# Patient Record
Sex: Female | Born: 1955 | Race: White | Hispanic: No | Marital: Married | Smoking: Never smoker
Health system: Southern US, Community
[De-identification: ages and names within clinical notes are randomized; demographics above are authoritative.]

## PROBLEM LIST (undated history)

## (undated) DIAGNOSIS — C9210 Chronic myeloid leukemia, BCR/ABL-positive, not having achieved remission: Secondary | ICD-10-CM

## (undated) DIAGNOSIS — F32A Depression, unspecified: Secondary | ICD-10-CM

## (undated) DIAGNOSIS — F329 Major depressive disorder, single episode, unspecified: Secondary | ICD-10-CM

## (undated) HISTORY — PX: EXPLORATORY LAPAROTOMY: SUR591

---

## 1998-01-09 ENCOUNTER — Other Ambulatory Visit: Admission: RE | Admit: 1998-01-09 | Discharge: 1998-01-09 | Payer: Self-pay | Admitting: Obstetrics and Gynecology

## 1999-04-22 ENCOUNTER — Encounter: Admission: RE | Admit: 1999-04-22 | Discharge: 1999-04-22 | Payer: Self-pay | Admitting: Family Medicine

## 2001-08-19 ENCOUNTER — Other Ambulatory Visit: Admission: RE | Admit: 2001-08-19 | Discharge: 2001-08-19 | Payer: Self-pay | Admitting: *Deleted

## 2002-08-21 ENCOUNTER — Other Ambulatory Visit: Admission: RE | Admit: 2002-08-21 | Discharge: 2002-08-21 | Payer: Self-pay | Admitting: *Deleted

## 2003-09-24 ENCOUNTER — Encounter: Admission: RE | Admit: 2003-09-24 | Discharge: 2003-09-24 | Payer: Self-pay | Admitting: Family Medicine

## 2003-09-26 ENCOUNTER — Ambulatory Visit (HOSPITAL_COMMUNITY): Admission: RE | Admit: 2003-09-26 | Discharge: 2003-09-26 | Payer: Self-pay | Admitting: Obstetrics

## 2005-05-15 ENCOUNTER — Ambulatory Visit (HOSPITAL_COMMUNITY): Admission: RE | Admit: 2005-05-15 | Discharge: 2005-05-15 | Payer: Self-pay | Admitting: Obstetrics

## 2006-07-13 IMAGING — US US TRANSVAGINAL NON-OB
1 series · 18 of 25 positions shown · non-contrast
Comparison: None.

CLINICAL DATA: Right ovarian cyst.
 TRANSABDOMINAL AND TRANSVAGINAL PELVIC ULTRASOUND:
TECHNIQUE: Both transabdominal and transvaginal ultrasound examinations of the pelvis were performed including evaluation of the uterus, ovaries, adnexal regions, and pelvic cul-de-sac.

[Series 1: us transvaginal non-ob · 18 of 42 slices shown]
[im 1/42]
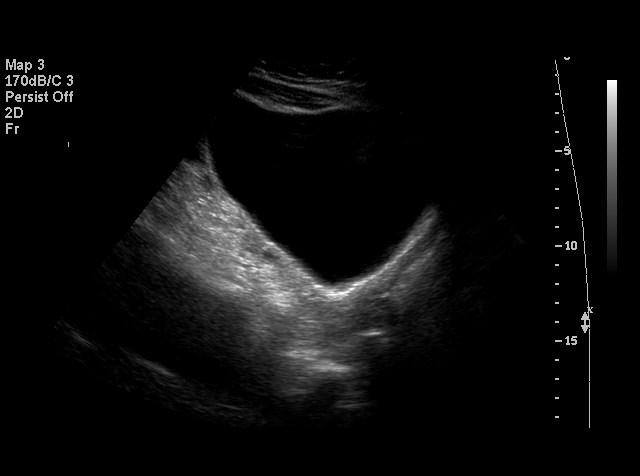
[im 4/42]
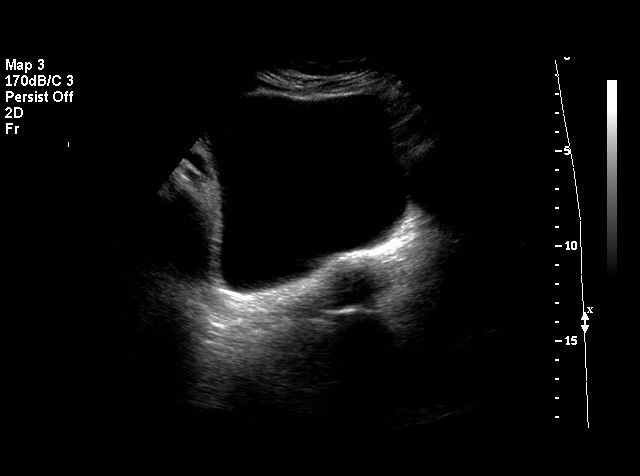
[im 6/42]
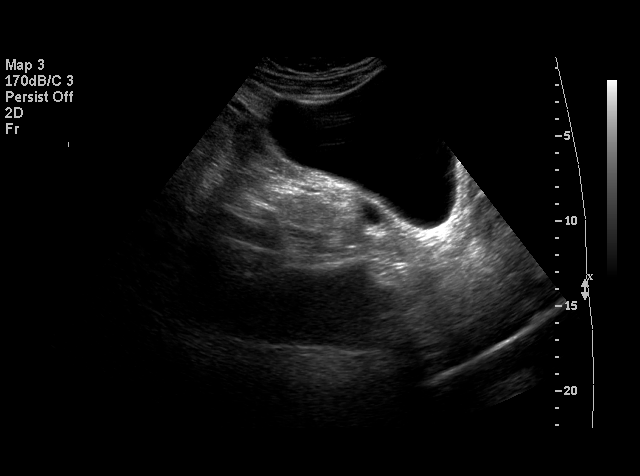
[im 7/42]
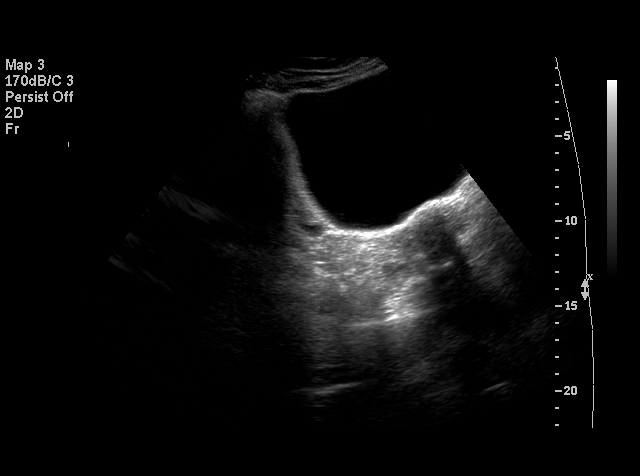
[im 11/42]
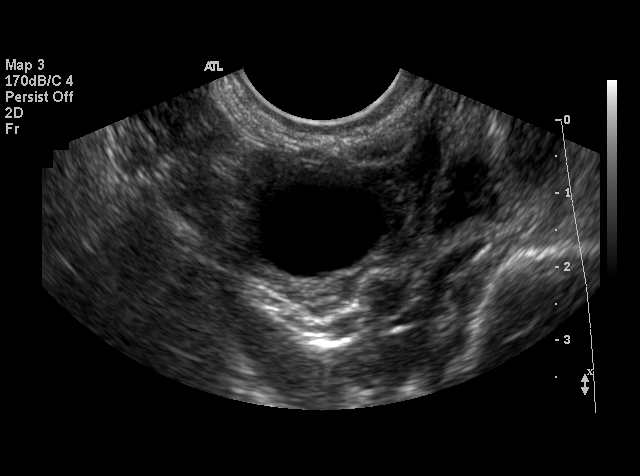
[im 12/42]
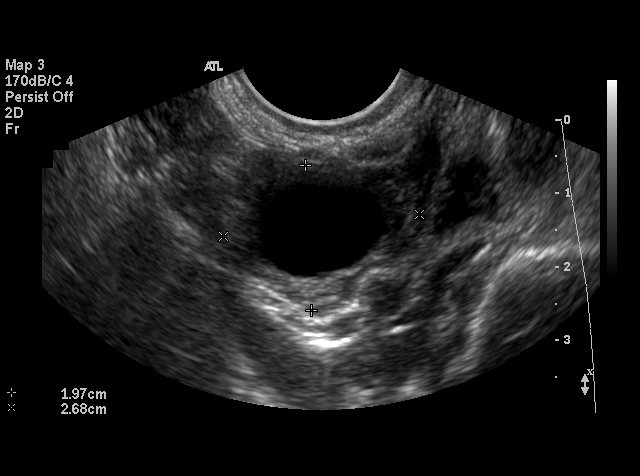
[im 16/42]
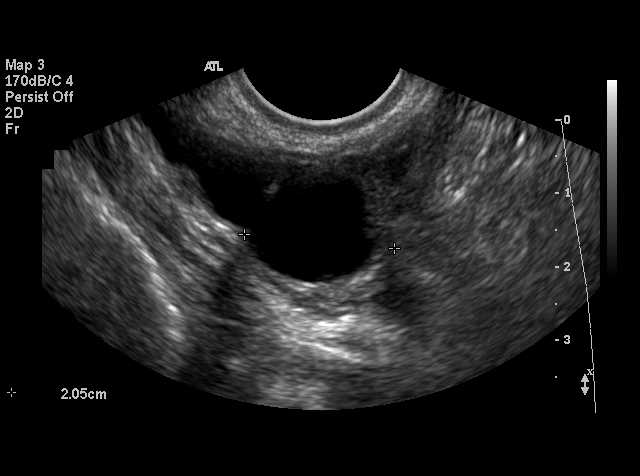
[im 18/42]
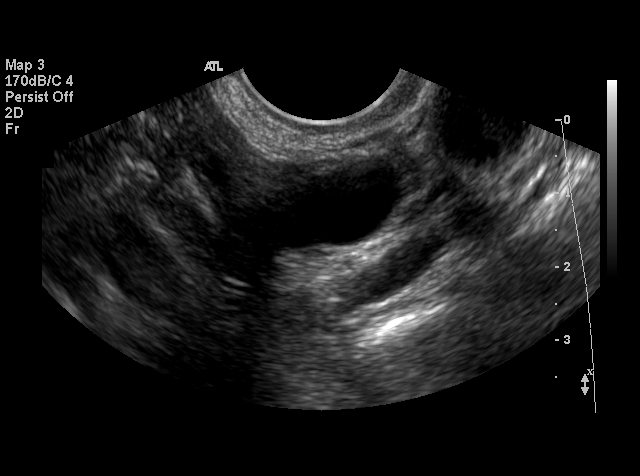
[im 19/42]
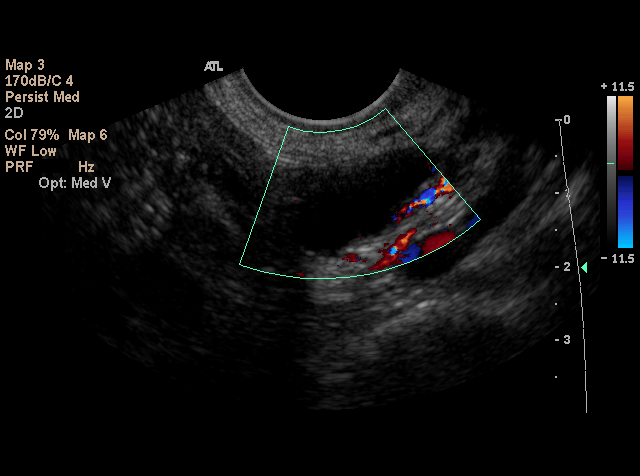
[im 23/42]
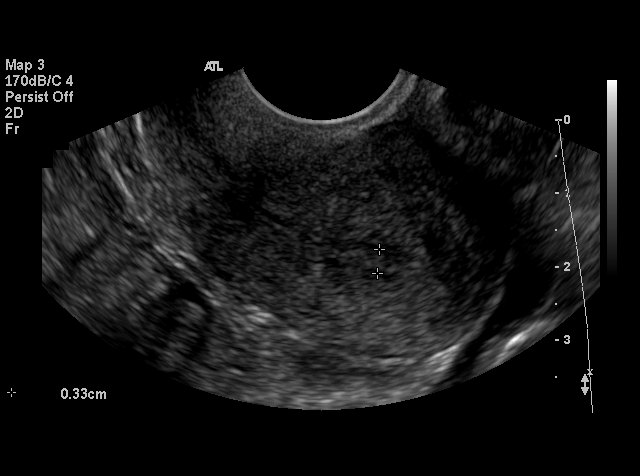
[im 24/42]
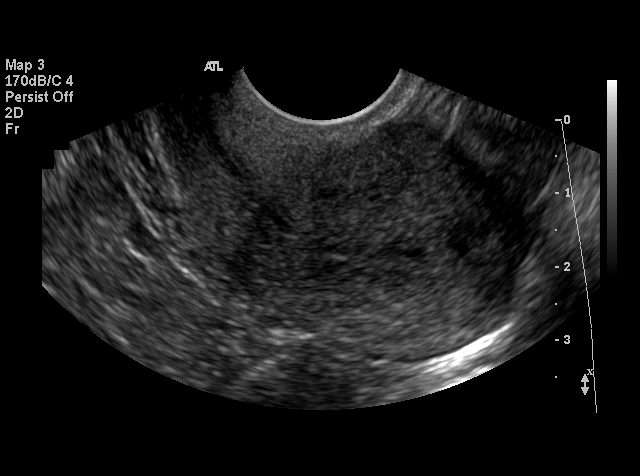
[im 26/42]
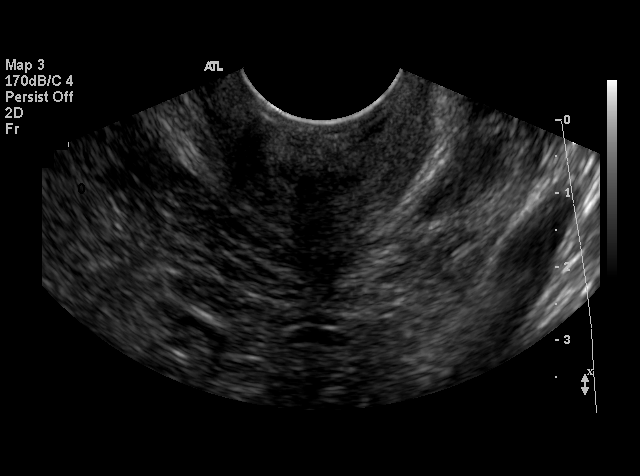
[im 30/42]
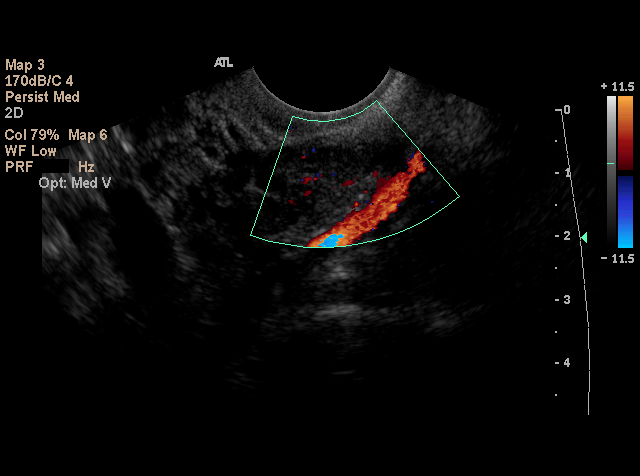
[im 31/42]
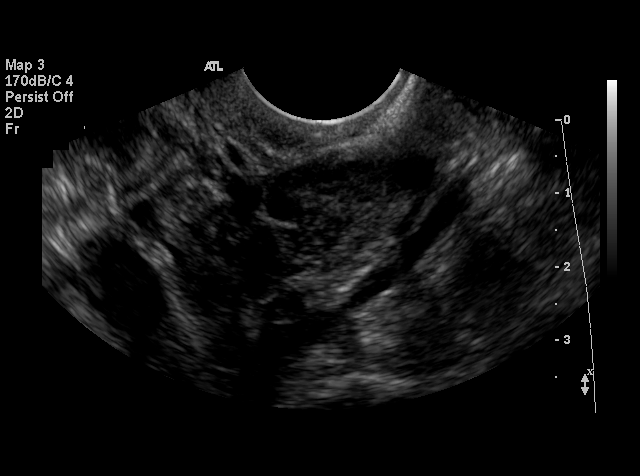
[im 35/42]
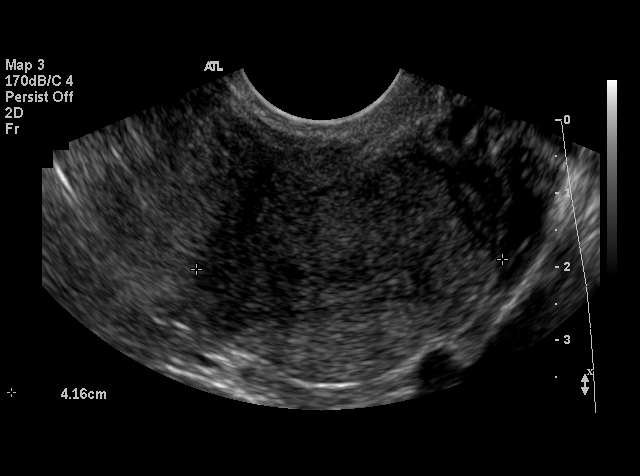
[im 36/42]
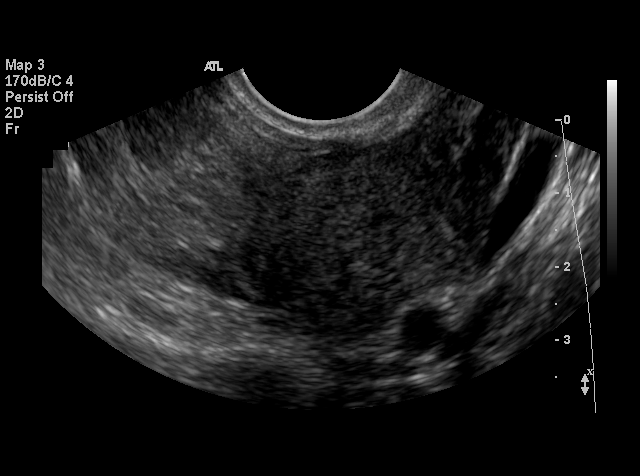
[im 38/42]
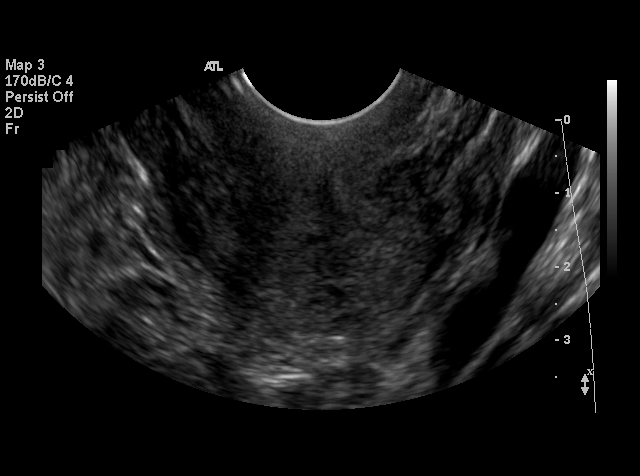
[im 42/42]
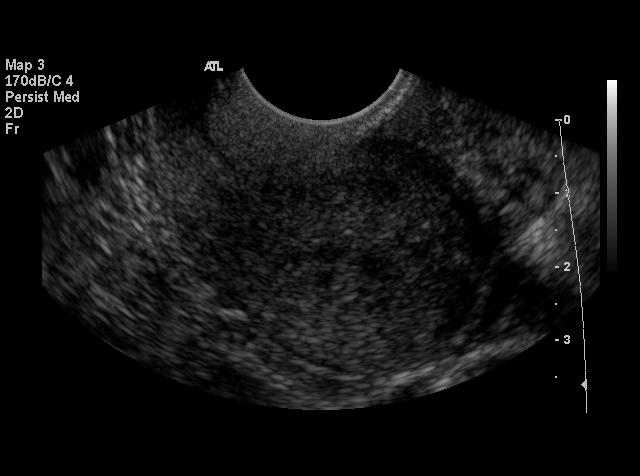

[18 of 25 positions shown; findings below may reference images not displayed]

FINDINGS: The uterus measures 5.6 x 3.3 x 4.2 cm.  The endometrial stripe is 3-4 mm in thickness.  Myometrial echo texture is homogeneous.  
 The right ovary measures 2.0 x 2.7 x 2.1 cm.  A 1.9 cm dominant follicle or small cyst is seen in the right ovary.  There is a tubular fluid-filled structure adjacent to the right ovary which likely represents hydrosalpinx. 
 The left ovary measures 2.3 x 1.5 x 2.7 cm and has a normal sonographic appearance.  No free fluid is apparent in the cul-de-sac.
IMPRESSION: Dominant follicle or small simple cyst in the right ovary.  There is a tubular fluid focus adjacent to the right ovary suggesting associated hydrosalpinx.  Follow-up ultrasound in six weeks may prove helpful to further evaluate.

## 2007-06-01 ENCOUNTER — Ambulatory Visit (HOSPITAL_COMMUNITY): Admission: RE | Admit: 2007-06-01 | Discharge: 2007-06-01 | Payer: Self-pay | Admitting: Obstetrics

## 2009-01-01 ENCOUNTER — Encounter: Admission: RE | Admit: 2009-01-01 | Discharge: 2009-01-01 | Payer: Self-pay | Admitting: Obstetrics and Gynecology

## 2010-07-05 ENCOUNTER — Encounter: Payer: Self-pay | Admitting: Obstetrics

## 2010-12-02 ENCOUNTER — Other Ambulatory Visit (HOSPITAL_COMMUNITY): Payer: Self-pay | Admitting: Obstetrics and Gynecology

## 2010-12-02 DIAGNOSIS — Z1231 Encounter for screening mammogram for malignant neoplasm of breast: Secondary | ICD-10-CM

## 2010-12-03 ENCOUNTER — Ambulatory Visit (HOSPITAL_COMMUNITY)
Admission: RE | Admit: 2010-12-03 | Discharge: 2010-12-03 | Disposition: A | Discharge status: Home / Self Care | Payer: Managed Care, Other (non HMO) | Source: Ambulatory Visit | Attending: Obstetrics and Gynecology | Admitting: Obstetrics and Gynecology

## 2010-12-03 DIAGNOSIS — Z1231 Encounter for screening mammogram for malignant neoplasm of breast: Secondary | ICD-10-CM | POA: Insufficient documentation

## 2015-09-29 ENCOUNTER — Emergency Department (HOSPITAL_BASED_OUTPATIENT_CLINIC_OR_DEPARTMENT_OTHER)
Admission: EM | Admit: 2015-09-29 | Discharge: 2015-09-29 | Disposition: A | Discharge status: Home / Self Care | Payer: Managed Care, Other (non HMO) | Attending: Emergency Medicine | Admitting: Emergency Medicine

## 2015-09-29 ENCOUNTER — Encounter (HOSPITAL_BASED_OUTPATIENT_CLINIC_OR_DEPARTMENT_OTHER): Payer: Self-pay | Admitting: Emergency Medicine

## 2015-09-29 DIAGNOSIS — Y9389 Activity, other specified: Secondary | ICD-10-CM | POA: Insufficient documentation

## 2015-09-29 DIAGNOSIS — S61219A Laceration without foreign body of unspecified finger without damage to nail, initial encounter: Secondary | ICD-10-CM

## 2015-09-29 DIAGNOSIS — Y9289 Other specified places as the place of occurrence of the external cause: Secondary | ICD-10-CM | POA: Insufficient documentation

## 2015-09-29 DIAGNOSIS — W260XXA Contact with knife, initial encounter: Secondary | ICD-10-CM | POA: Insufficient documentation

## 2015-09-29 DIAGNOSIS — Y998 Other external cause status: Secondary | ICD-10-CM | POA: Insufficient documentation

## 2015-09-29 DIAGNOSIS — Z856 Personal history of leukemia: Secondary | ICD-10-CM | POA: Insufficient documentation

## 2015-09-29 DIAGNOSIS — S61215A Laceration without foreign body of left ring finger without damage to nail, initial encounter: Secondary | ICD-10-CM | POA: Insufficient documentation

## 2015-09-29 DIAGNOSIS — Z880 Allergy status to penicillin: Secondary | ICD-10-CM | POA: Insufficient documentation

## 2015-09-29 HISTORY — DX: Chronic myeloid leukemia, BCR/ABL-positive, not having achieved remission: C92.10

## 2015-09-29 NOTE — ED Notes (Signed)
Dr Maryan Rued in room with patient now.  Pt states last tetanus shot was about 8 years ago but is unsure of receiving the vaccine while on chemotherapy medications.  Pt states scissors "was not a very clean pair."

## 2015-09-29 NOTE — ED Notes (Signed)
Pt in with laceration to L ring finger from knife today while cutting a tie. Bleeding controlled, TDap 8 years ago.

## 2015-09-29 NOTE — Discharge Instructions (Signed)
Nonsutured Laceration Care °A laceration is a cut that goes through all layers of the skin and extends into the tissue that is right under the skin. This type of cut is usually stitched up (sutured) or closed with tape (adhesive strips) or skin glue shortly after the injury happens. °However, if the wound is dirty or if several hours pass before medical treatment is provided, it is likely that germs (bacteria) will enter the wound. Closing a laceration after bacteria have entered it increases the risk of infection. In these cases, your health care provider may leave the laceration open (nonsutured) and cover it with a bandage. This type of treatment helps prevent infection and allows the wound to heal from the deepest layer of tissue damage up to the surface. °An open fracture is a type of injury that may involve nonsutured lacerations. An open fracture is a break in a bone that happens along with one or more lacerations through the skin that is near the fracture site. °HOW TO CARE FOR YOUR NONSUTURED LACERATION °· Take or apply over-the-counter and prescription medicines only as told by your health care provider. °· If you were prescribed an antibiotic medicine, take or apply it as told by your health care provider. Do not stop using the antibiotic even if your condition improves. °· Clean the wound one time each day or as told by your health care provider. °¨ Wash the wound with mild soap and water. °¨ Rinse the wound with water to remove all soap. °¨ Pat your wound dry with a clean towel. Do not rub the wound. °· Do not inject anything into the wound unless your health care provider told you to. °· Change any bandages (dressings) as told by your health care provider. This includes changing the dressing if it gets wet, dirty, or starts to smell bad. °· Keep the dressing dry until your health care provider says it can be removed. Do not take baths, swim, or do anything that puts your wound underwater until your  health care provider approves. °· Raise (elevate) the injured area above the level of your heart while you are sitting or lying down, if possible. °· Do not scratch or pick at the wound. °· Check your wound every day for signs of infection. Watch for: °¨ Redness, swelling, or pain. °¨ Fluid, blood, or pus. °· Keep all follow-up visits as told by your health care provider. This is important. °SEEK MEDICAL CARE IF: °· You received a tetanus and shot and you have swelling, severe pain, redness, or bleeding at the injection site.   °· You have a fever. °· Your pain is not controlled with medicine. °· You have increased redness, swelling, or pain at the site of your wound. °· You have fluid, blood, or pus coming from your wound. °· You notice a bad smell coming from your wound or your dressing. °· You notice something coming out of the wound, such as wood or glass. °· You notice a change in the color of your skin near your wound. °· You develop a new rash. °· You need to change the dressing frequently due to fluid, blood, or pus draining from the wound. °· You develop numbness around your wound. °SEEK IMMEDIATE MEDICAL CARE IF: °· Your pain suddenly increases and is severe. °· You develop severe swelling around the wound. °· The wound is on your hand or foot and you cannot properly move a finger or toe. °· The wound is on your hand or   foot and you notice that your fingers or toes look pale or bluish. °· You have a red streak going away from your wound. °  °This information is not intended to replace advice given to you by your health care provider. Make sure you discuss any questions you have with your health care provider. °  °Document Released: 04/29/2006 Document Revised: 10/16/2014 Document Reviewed: 05/28/2014 °Elsevier Interactive Patient Education ©2016 [employer] ° °

## 2015-09-29 NOTE — ED Provider Notes (Signed)
CSN: [id number]     Arrival date & time 09/29/15  1654 History  By signing my name below, I, [name], attest that this documentation has been prepared under the direction and in the presence of Blanchie Dessert, MD.   Electronically Signed: [name], ED Scribe. 09/29/2015. 6:34 PM  Chief Complaint  Patient presents with  . Extremity Laceration    The history is provided by the patient. No language interpreter was used.   HPI Comments: Krystal Price is a 60 y.o. female with PMHx of CML who presents to the Emergency Department complaining of sudden onset, left ring finger laceration, onset earlier today when she cut the area on a knife. She reports associated pain to the area. No other associated symptoms noted. No worsening or alleviating factors noted. Pt denies numbness, weakness, or any other pertinent symptoms. Pt had her last tetanus shot 8 years ago but requested not to have her tetanus updated.   Past Medical History  Diagnosis Date  . CML (chronic myelocytic leukemia) ([place])    Past Surgical History  Procedure Laterality Date  . Exploratory laparotomy     History reviewed. No pertinent family history. Social History  Substance Use Topics  . Smoking status: Never Smoker   . Smokeless tobacco: None  . Alcohol Use: No   OB History    No data available     Review of Systems A complete 10 system review of systems was obtained and all systems are negative except as noted in the HPI and PMH.   Allergies  Penicillins  Home Medications   Prior to Admission medications   Not on File   BP 139/98 mmHg  Pulse 66  Temp(Src) 98.9 F (37.2 C) (Oral)  Resp 18  Ht 5\' 5"  (1.651 m)  Wt 130 lb (58.968 kg)  BMI 21.63 kg/m2  SpO2 100% Physical Exam  Constitutional: She is oriented to person, place, and time. She appears well-developed and well-nourished.  HENT:  Head: Normocephalic.  Eyes: EOM are normal.  Neck: Normal range of motion.  Pulmonary/Chest:  Effort normal.  Abdominal: She exhibits no distension.  Musculoskeletal: Normal range of motion.  Neurological: She is alert and oriented to person, place, and time.  Skin: Laceration noted.  1 cm laceration to the left ring finger over the palmar DIP joint. Hemostatic. Not gaping. Normal PIP and DIP function. Normal sensation noted.   Psychiatric: She has a normal mood and affect.  Nursing note and vitals reviewed.   ED Course  Procedures (including critical care time) DIAGNOSTIC STUDIES: Oxygen Saturation is 100% on RA, normal by my interpretation.    COORDINATION OF CARE: 6:50 PM-Discussed treatment plan with pt at bedside and pt agreed to plan.   Labs Review Labs Reviewed - No data to display  Imaging Review No results found.   EKG Interpretation None      LACERATION REPAIR Performed by: Blanchie Dessert Authorized byBlanchie Dessert Consent: Verbal consent obtained. Risks and benefits: risks, benefits and alternatives were discussed Consent given by: patient Patient identity confirmed: provided demographic data Prepped and Draped in normal sterile fashion Wound explored  Laceration Location: left ring finger  Laceration Length: 1cm  No Foreign Bodies seen or palpated  Anesthesia: noneIrrigation method: syringe Amount of cleaning: standard  Skin closure: dermabond  Patient tolerance: Patient tolerated the procedure well with no immediate complications.   MDM   Final diagnoses:  Finger laceration, initial encounter   Patient with uncomplicated superficial laceration to the palmar surface  of the left ring finger. She has full range of motion at the DIP and PIP joint without evidence of tendon injury.  I personally performed the services described in this documentation, which was scribed in my presence.  The recorded information has been reviewed and considered.    Blanchie Dessert, MD 09/29/15 2322

## 2016-01-18 ENCOUNTER — Emergency Department (HOSPITAL_COMMUNITY): Payer: BLUE CROSS/BLUE SHIELD

## 2016-01-18 ENCOUNTER — Encounter (HOSPITAL_COMMUNITY): Payer: Self-pay | Admitting: Emergency Medicine

## 2016-01-18 ENCOUNTER — Emergency Department (HOSPITAL_COMMUNITY)
Admission: EM | Admit: 2016-01-18 | Discharge: 2016-01-18 | Payer: BLUE CROSS/BLUE SHIELD | Attending: Physician Assistant | Admitting: Physician Assistant

## 2016-01-18 DIAGNOSIS — Z79899 Other long term (current) drug therapy: Secondary | ICD-10-CM | POA: Insufficient documentation

## 2016-01-18 DIAGNOSIS — R21 Rash and other nonspecific skin eruption: Secondary | ICD-10-CM

## 2016-01-18 DIAGNOSIS — R51 Headache: Secondary | ICD-10-CM | POA: Diagnosis not present

## 2016-01-18 DIAGNOSIS — Z791 Long term (current) use of non-steroidal anti-inflammatories (NSAID): Secondary | ICD-10-CM | POA: Insufficient documentation

## 2016-01-18 DIAGNOSIS — M791 Myalgia: Secondary | ICD-10-CM | POA: Diagnosis present

## 2016-01-18 DIAGNOSIS — R509 Fever, unspecified: Secondary | ICD-10-CM | POA: Diagnosis not present

## 2016-01-18 HISTORY — DX: Depression, unspecified: F32.A

## 2016-01-18 HISTORY — DX: Major depressive disorder, single episode, unspecified: F32.9

## 2016-01-18 LAB — COMPREHENSIVE METABOLIC PANEL
ALT: 107 U/L — ABNORMAL HIGH (ref 14–54)
AST: 88 U/L — ABNORMAL HIGH (ref 15–41)
Albumin: 3.9 g/dL (ref 3.5–5.0)
Alkaline Phosphatase: 90 U/L (ref 38–126)
Anion gap: 8 (ref 5–15)
BUN: 14 mg/dL (ref 6–20)
CO2: 25 mmol/L (ref 22–32)
Calcium: 8.4 mg/dL — ABNORMAL LOW (ref 8.9–10.3)
Chloride: 101 mmol/L (ref 101–111)
Creatinine, Ser: 0.65 mg/dL (ref 0.44–1.00)
GFR calc Af Amer: >60 mL/min (ref >60)
GFR calc non Af Amer: >60 mL/min (ref >60)
Glucose, Bld: 105 mg/dL — ABNORMAL HIGH (ref 65–99)
Potassium: 3.7 mmol/L (ref 3.5–5.1)
Sodium: 134 mmol/L — ABNORMAL LOW (ref 135–145)
Total Bilirubin: 0.7 mg/dL (ref 0.3–1.2)
Total Protein: 7.3 g/dL (ref 6.5–8.1)

## 2016-01-18 LAB — URINE MICROSCOPIC-ADD ON
Bacteria, UA: NONE SEEN
RBC / HPF: NONE SEEN RBC/hpf (ref 0–5)

## 2016-01-18 LAB — CBC WITH DIFFERENTIAL/PLATELET
Basophils Absolute: 0 10*3/uL (ref 0.0–0.1)
Basophils Relative: 0 %
Eosinophils Absolute: 0 10*3/uL (ref 0.0–0.7)
Eosinophils Relative: 0 %
HCT: 30.7 % — ABNORMAL LOW (ref 36.0–46.0)
Hemoglobin: 10.3 g/dL — ABNORMAL LOW (ref 12.0–15.0)
Lymphocytes Relative: 14 %
Lymphs Abs: 1.2 10*3/uL (ref 0.7–4.0)
MCH: 30.8 pg (ref 26.0–34.0)
MCHC: 33.6 g/dL (ref 30.0–36.0)
MCV: 91.9 fL (ref 78.0–100.0)
Monocytes Absolute: 0.9 10*3/uL (ref 0.1–1.0)
Monocytes Relative: 11 %
Neutro Abs: 6.4 10*3/uL (ref 1.7–7.7)
Neutrophils Relative %: 75 %
Platelets: 182 10*3/uL (ref 150–400)
RBC: 3.34 MIL/uL — ABNORMAL LOW (ref 3.87–5.11)
RDW: 13.8 % (ref 11.5–15.5)
WBC: 8.5 10*3/uL (ref 4.0–10.5)

## 2016-01-18 LAB — URINALYSIS, ROUTINE W REFLEX MICROSCOPIC
Bilirubin Urine: NEGATIVE
Glucose, UA: NEGATIVE mg/dL
Ketones, ur: 15 mg/dL — AB
Nitrite: NEGATIVE
Protein, ur: NEGATIVE mg/dL
Specific Gravity, Urine: 1.01 (ref 1.005–1.030)
pH: 6.5 (ref 5.0–8.0)

## 2016-01-18 LAB — I-STAT CG4 LACTIC ACID, ED
Lactic Acid, Venous: 0.95 mmol/L (ref 0.5–1.9)
Lactic Acid, Venous: 0.64 mmol/L (ref 0.5–1.9)

## 2016-01-18 LAB — CK: Total CK: 68 U/L (ref 38–234)

## 2016-01-18 MED ORDER — VANCOMYCIN HCL 10 G IV SOLR: 1500.0000 mg | INTRAVENOUS | Status: DC

## 2016-01-18 MED ORDER — VANCOMYCIN HCL IN DEXTROSE 1-5 GM/200ML-% IV SOLN
1000.0000 mg | INTRAVENOUS | Status: DC
Start: 2016-01-18 — End: 2016-01-18

## 2016-01-18 MED ORDER — SODIUM CHLORIDE 0.9 % IV BOLUS (SEPSIS)
1000.0000 mL | Freq: Once | INTRAVENOUS | Status: AC
  Administered 2016-01-18: 1000 mL via INTRAVENOUS

## 2016-01-18 MED ORDER — ACETAMINOPHEN 325 MG PO TABS
650.0000 mg | ORAL_TABLET | Freq: Once | ORAL | Status: AC
  Administered 2016-01-18: 650 mg via ORAL
  Filled 2016-01-18: qty 2

## 2016-01-18 MED ORDER — DOXYCYCLINE HYCLATE 100 MG IV SOLR
100.0000 mg | Freq: Once | INTRAVENOUS | Status: DC
  Filled 2016-01-18: qty 100

## 2016-01-18 MED ORDER — ACETAMINOPHEN 325 MG PO TABS
650.0000 mg | ORAL_TABLET | Freq: Once | ORAL | Status: AC | PRN
  Administered 2016-01-18: 650 mg via ORAL
  Filled 2016-01-18: qty 2

## 2016-01-18 NOTE — ED Provider Notes (Addendum)
[facility] DEPT Provider Note   CSN: [id number] Arrival date & time: 01/18/16  1139  First Provider Contact:  None       History   Chief Complaint Chief Complaint  Patient presents with  . Generalized Body Aches  . Rash  . Fever    HPI Krystal Price is a 60 y.o. female.  HPI   60 yo with ho CML, diagnosed 6 years on maintenance medication presenting today with headache fever and myalgias and rash. Started on Wednesday, mild fever and headaceh. Rash started on buttocks and hips. No menigismu, vomiting, nausea.   No known tick exposure or travel.   Past Medical History:  Diagnosis Date  . CML (chronic myelocytic leukemia) ([place])   . CML (chronic myelocytic leukemia) ([place])   . Depression     There are no active problems to display for this patient.   Past Surgical History:  Procedure Laterality Date  . EXPLORATORY LAPAROTOMY      OB History    No data available       Home Medications    Prior to Admission medications   Medication Sig Start Date End Date Taking? Authorizing Provider  acetaminophen (TYLENOL) 500 MG tablet Take 500 mg by mouth every 6 (six) hours as needed for moderate pain.   Yes Historical Provider, MD  ibuprofen (ADVIL,MOTRIN) 200 MG tablet Take 400 mg by mouth every 6 (six) hours as needed for moderate pain.   Yes Historical Provider, MD  Multiple Vitamin (MULTIVITAMIN WITH MINERALS) TABS tablet Take 2 tablets by mouth daily.   Yes Historical Provider, MD  SPRYCEL 100 MG tablet Take 100 mg by mouth daily. 01/13/16  Yes Historical Provider, MD  venlafaxine XR (EFFEXOR-XR) 37.5 MG 24 hr capsule Take 150 mg by mouth daily. 11/29/15  Yes Historical Provider, MD    Family History No family history on file.  Social History Social History  Substance Use Topics  . Smoking status: Never Smoker  . Smokeless tobacco: Not on file  . Alcohol use No     Allergies   Penicillins   Review of Systems Review of Systems  Constitutional:  Positive for fatigue and fever. Negative for activity change.  Respiratory: Negative for shortness of breath.   Cardiovascular: Negative for chest pain.  Gastrointestinal: Negative for abdominal pain.  Musculoskeletal: Positive for myalgias.  Skin: Positive for rash.  Neurological: Positive for headaches.  All other systems reviewed and are negative.    Physical Exam Updated Vital Signs BP 110/60   Pulse 95   Temp 100.2 F (37.9 C)   Resp 18   Ht 5\' 5"  (1.651 m)   Wt 128 lb (58.1 kg)   SpO2 96%   BMI 21.30 kg/m   Physical Exam  Constitutional: She is oriented to person, place, and time. She appears well-developed and well-nourished. No distress.  HENT:  Head: Normocephalic and atraumatic.  Eyes: Conjunctivae are normal.  Neck: Neck supple.  Cardiovascular: Normal rate and regular rhythm.   No murmur heard. Pulmonary/Chest: Effort normal and breath sounds normal. No respiratory distress.  Abdominal: Soft. There is no tenderness.  Musculoskeletal: She exhibits no edema.  Neurological: She is alert and oriented to person, place, and time. No cranial nerve deficit.  Skin: Skin is warm and dry. Rash noted.  Macular rash on bottocks, across lower abdomen and pubic area. Blanching, slightly puritic.   Psychiatric: She has a normal mood and affect.  Nursing note and vitals reviewed.    ED  Treatments / Results  Labs (all labs ordered are listed, but only abnormal results are displayed) Labs Reviewed  COMPREHENSIVE METABOLIC PANEL - Abnormal; Notable for the following:       Result Value   Sodium 134 (*)    Glucose, Bld 105 (*)    Calcium 8.4 (*)    AST 88 (*)    ALT 107 (*)    All other components within normal limits  URINALYSIS, ROUTINE W REFLEX MICROSCOPIC (NOT AT [facility]) - Abnormal; Notable for the following:    Hgb urine dipstick TRACE (*)    Ketones, ur 15 (*)    Leukocytes, UA SMALL (*)    All other components within normal limits  CBC WITH  DIFFERENTIAL/PLATELET - Abnormal; Notable for the following:    RBC 3.34 (*)    Hemoglobin 10.3 (*)    HCT 30.7 (*)    All other components within normal limits  URINE MICROSCOPIC-ADD ON - Abnormal; Notable for the following:    Squamous Epithelial / LPF 0-5 (*)    All other components within normal limits  CULTURE, BLOOD (ROUTINE X 2)  CULTURE, BLOOD (ROUTINE X 2)  URINE CULTURE  CK  I-STAT CG4 LACTIC ACID, ED  I-STAT CG4 LACTIC ACID, ED    EKG  EKG Interpretation None       Radiology Dg Chest 2 View  Result Date: 01/18/2016 CLINICAL DATA:  Headache, generalized body aches, fever and generalized rash to buttocks onset Wednesday. EXAM: CHEST  2 VIEW COMPARISON:  Chest x-ray dated 05/07/2008. FINDINGS: Cardiomediastinal silhouette is normal in size and configuration. Lungs are clear. Lung volumes are normal. No evidence of pneumonia. No pleural effusion. No pneumothorax. Mild dextroscoliosis. Osseous and soft tissue structures about the chest are otherwise unremarkable. IMPRESSION: No active cardiopulmonary disease. No evidence of pneumonia. Electronically Signed   By: Franki Cabot M.D.   On: 01/18/2016 12:42    Procedures Procedures (including critical care time)  Medications Ordered in ED Medications  acetaminophen (TYLENOL) tablet 650 mg (650 mg Oral Given 01/18/16 1155)  sodium chloride 0.9 % bolus 1,000 mL (1,000 mLs Intravenous New Bag/Given 01/18/16 1700)  acetaminophen (TYLENOL) tablet 650 mg (650 mg Oral Given 01/18/16 1759)     Initial Impression / Assessment and Plan / ED Course  I have reviewed the triage vital signs and the nursing notes.  Pertinent labs & imaging results that were available during my care of the patient were reviewed by me and considered in my medical decision making (see chart for details).  Clinical Course   Patient is a 60 year old female presenting with rash to bilateral buttocks, side thigh and groin. This started 2 days ago with low-grade  fever. Patient's only past medical history is history of CML for which she is on Dasatinib  For the last 6 years.           3:47 PM Onocology thinks that is unrelated to [facility] or the medication she is on.   Will admit,  Discussed with admittign team, they don't feel comfortable without derm input and we have no dermatology.   4:17 PM Called PALS line at [place] for derm input.   4:42 PM They offered an non-offical expanded differential such as CTCL, dress syndrome, vascultiis.  However there are no beds at [facility].    I am favoring admission for patient given her continued fever, elevated ast/alt and spreading rash of unknown significant. Will await patient's husband to discuss possible transfer to [facility] or  [facility].   6:18 PM Transferred to [facility]. Discussed with derm, they will see patient as inpatient. Transferred to ED at [facility] (discussed on care line with Dr. Izola Price)   Final Clinical Impressions(s) / ED Diagnoses   Final diagnoses:  Rash    New Prescriptions New Prescriptions   No medications on file     Thorvald Orsino Julio Alm, MD 01/18/16 [place], MD 01/18/16 [place], MD 01/18/16 [place], MD 01/18/16 1821

## 2016-01-18 NOTE — ED Triage Notes (Signed)
Pt complaint of headache, generalized body aches, fever, and generalized rash to buttocks onset Wednesday; hx of CML.

## 2016-01-18 NOTE — ED Notes (Signed)
I have just called report to [facility] charge nurse [facility]; and they are accepting her. She remains in no distress.

## 2016-01-18 NOTE — ED Notes (Signed)
Arrangements for transfer and transport to [facility] ED have been made.

## 2016-01-18 NOTE — Progress Notes (Signed)
Pharmacy Antibiotic Note  Krystal Price is a 60 y.o. female admitted on 01/18/2016 with cellulitis.  Rash noted on bilateral buttocks, side thigh and groin.  PMH includes Dasatinib chemotherapy for CML.  EDP notes that "Onocology thinks that is unrelated to [facility] or the medication."  Pharmacy has been consulted for Vancomycin dosing.  Plan: Vancomycin 1g IV now, then 1500 mg IV q24h.  Measure Vanc trough at steady state (goal trough 10-15 for cellulitis) Follow up renal fxn, culture results, and clinical course. Follow up ID consult.  Height: 5\' 5"  (165.1 cm) Weight: 128 lb (58.1 kg) IBW/kg (Calculated) : 57  Temp (24hrs), Avg:100.3 F (37.9 C), Min:100.3 F (37.9 C), Max:100.3 F (37.9 C)   Recent Labs Lab 01/18/16 1234 01/18/16 1246 01/18/16 1457 01/18/16 1509  WBC  --   --  8.5  --   CREATININE 0.65  --   --   --   LATICACIDVEN  --  0.95  --  0.64    Estimated Creatinine Clearance: 68.1 mL/min (by C-G formula based on SCr of 0.8 mg/dL).    Allergies  Allergen Reactions  . Penicillins Rash    Abdominal rash Has patient had a PCN reaction causing immediate rash, facial/tongue/throat swelling, SOB or lightheadedness with hypotension: Yes Has patient had a PCN reaction causing severe rash involving mucus membranes or skin necrosis: No Has patient had a PCN reaction that required hospitalization No Has patient had a PCN reaction occurring within the last 10 years: No If all of the above answers are "NO", then may proceed with Cephalosporin use.     Antimicrobials this admission: 8/5 Vancomycin >>   Dose adjustments this admission:   Microbiology results: 8/5 BCx: pending 8/5 UCx: pending   Thank you for allowing pharmacy to be a part of this patient's care.  Gretta Arab PharmD, BCPS Pager [phone] 01/18/2016 4:18 PM

## 2016-01-19 LAB — URINE CULTURE

## 2016-01-23 LAB — CULTURE, BLOOD (ROUTINE X 2)
Culture: NO GROWTH
Culture: NO GROWTH

## 2017-03-17 IMAGING — CR DG CHEST 2V
2 series · 2 of 2 positions shown · non-contrast
Comparison: Chest x-ray dated 05/07/2008.

CLINICAL DATA: Headache, generalized body aches, fever and
generalized rash to buttocks onset [REDACTED].

EXAM:
CHEST  2 VIEW

[w chest pa]
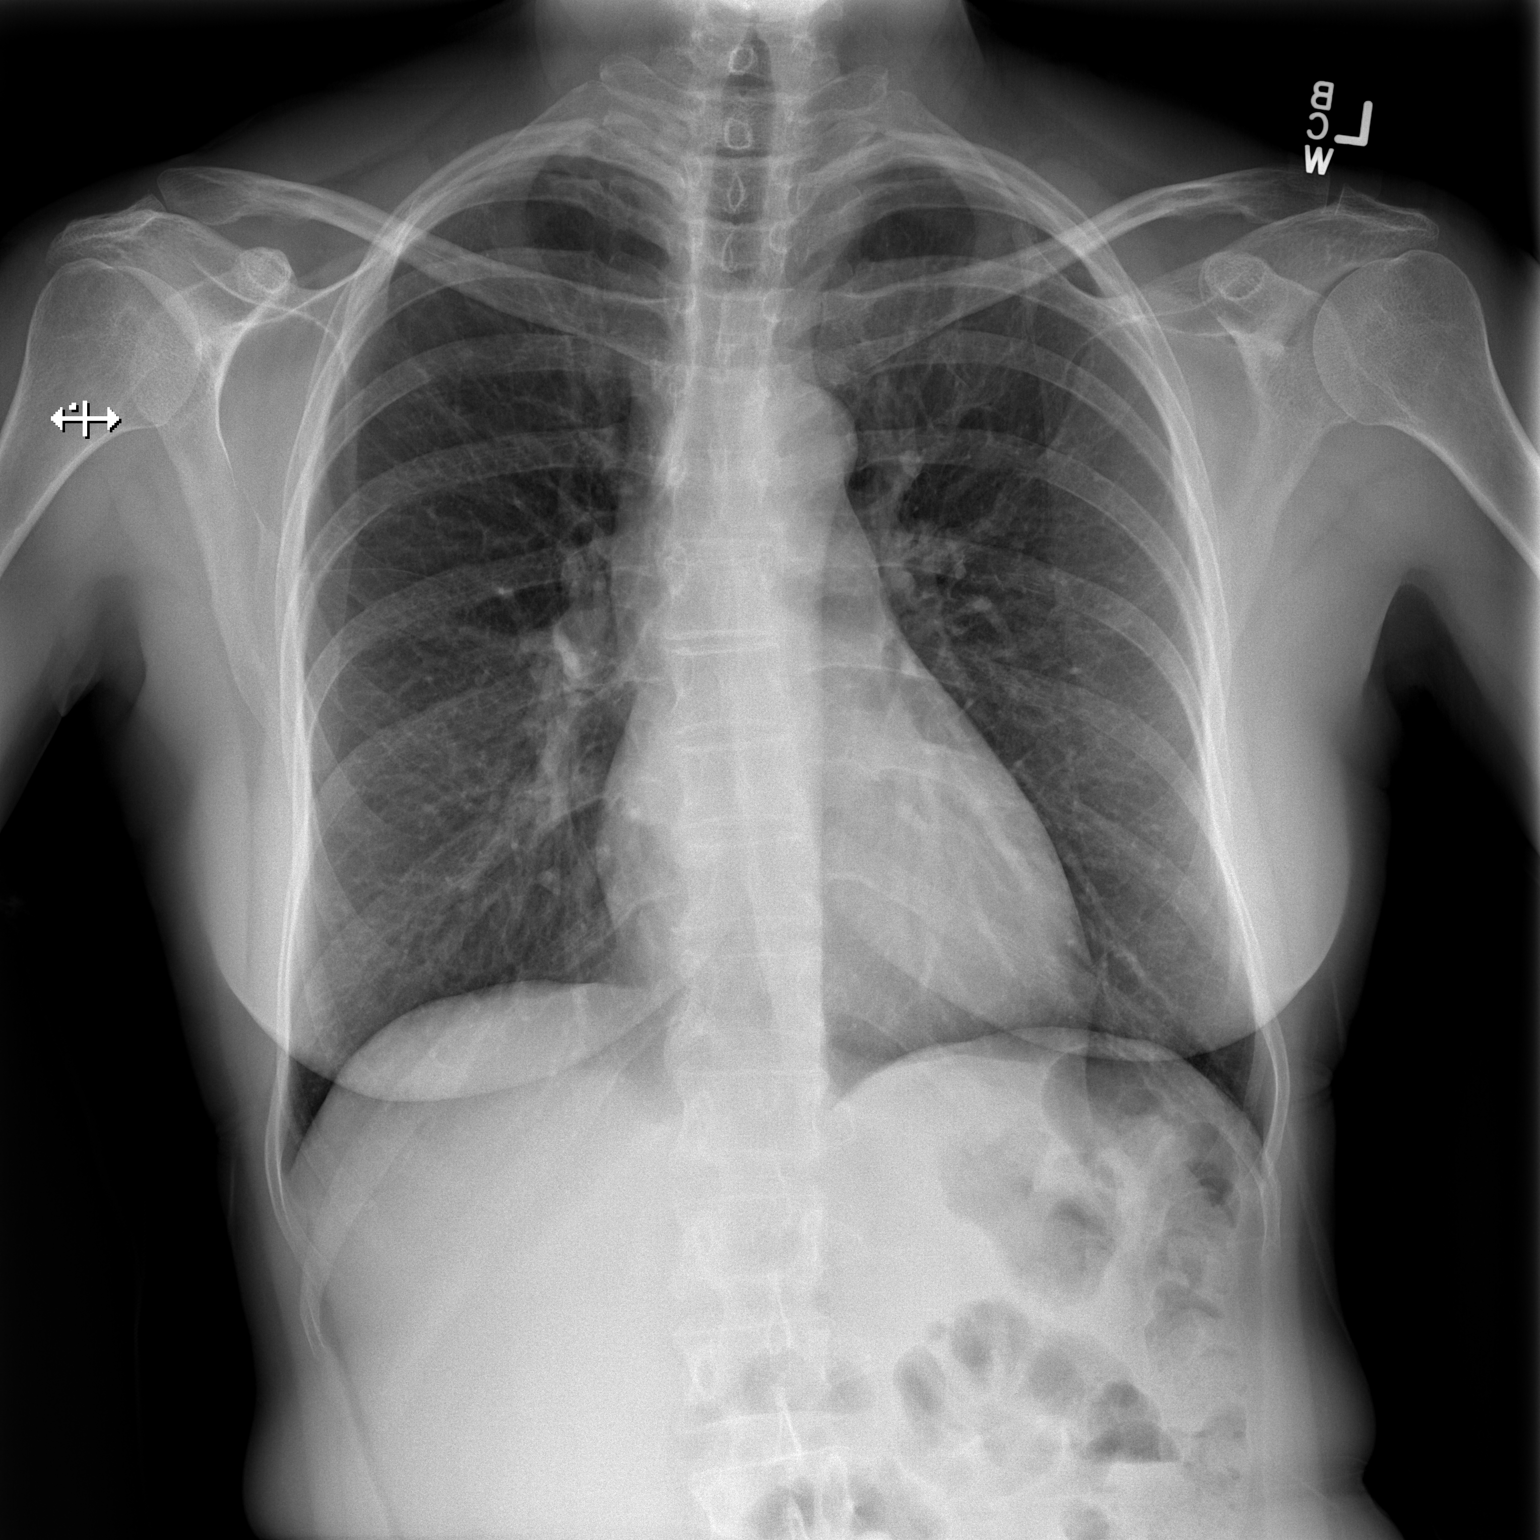

[w chest lat]
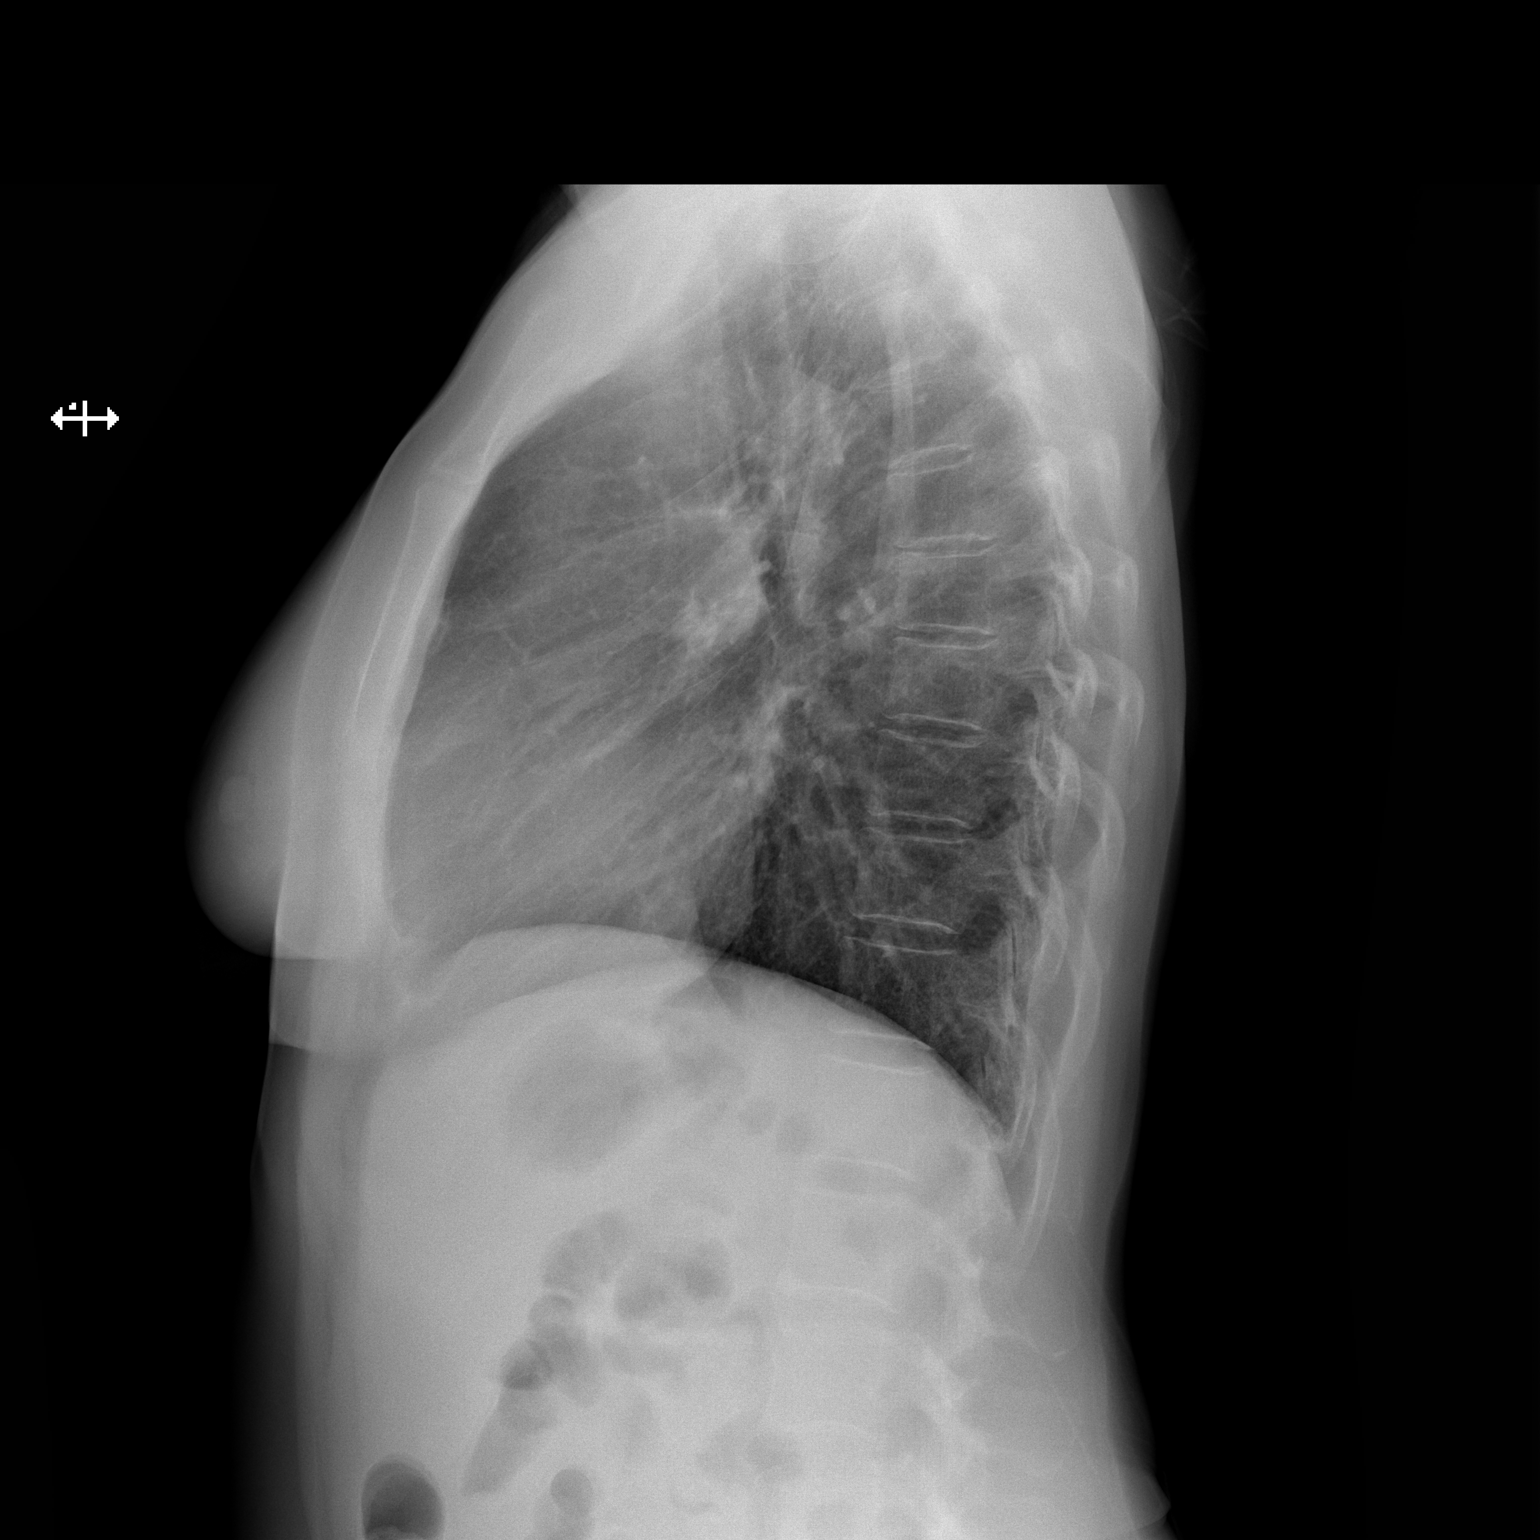

[2 of 2 positions shown; findings below may reference images not displayed]

FINDINGS: Cardiomediastinal silhouette is normal in size and configuration.
Lungs are clear. Lung volumes are normal. No evidence of pneumonia.
No pleural effusion. No pneumothorax.

Mild dextroscoliosis. Osseous and soft tissue structures about the
chest are otherwise unremarkable.
IMPRESSION: No active cardiopulmonary disease. No evidence of pneumonia.

## 2017-05-25 ENCOUNTER — Other Ambulatory Visit: Payer: Self-pay | Admitting: Family Medicine

## 2017-05-25 DIAGNOSIS — M545 Low back pain: Secondary | ICD-10-CM

## 2018-03-16 ENCOUNTER — Ambulatory Visit: Payer: Self-pay | Admitting: Psychiatry

## 2018-03-16 ENCOUNTER — Telehealth: Payer: Self-pay

## 2018-03-16 DIAGNOSIS — F321 Major depressive disorder, single episode, moderate: Secondary | ICD-10-CM | POA: Insufficient documentation

## 2018-03-16 NOTE — Telephone Encounter (Signed)
Called to check on pt per provider, she didn't know she had an appt today at 1:30pm noone called her back this am. Pt is now scheduled for 10/03 at 11:00am. She is aware as well as provider.

## 2018-03-17 ENCOUNTER — Ambulatory Visit (INDEPENDENT_AMBULATORY_CARE_PROVIDER_SITE_OTHER): Payer: BLUE CROSS/BLUE SHIELD | Admitting: Psychiatry

## 2018-03-17 DIAGNOSIS — F331 Major depressive disorder, recurrent, moderate: Secondary | ICD-10-CM | POA: Diagnosis not present

## 2018-03-17 DIAGNOSIS — F411 Generalized anxiety disorder: Secondary | ICD-10-CM

## 2018-03-17 NOTE — Progress Notes (Signed)
[facility] Med Check  Patient ID: Krystal Price,  MRN: [id number]  PCP: Patient, No Pcp Per  Date of Evaluation: 03/17/2018 Time spent:30 minutes   HISTORY/CURRENT STATUS: HPI Very depressed last 2 weeks.  Daughter's marriage splitting up.  Son's wife sexuality issues.  No self confidence.   Not sure why.  Doesn't feel worth while.  Helps with gkids (7) and doiesn't feel appreciated.  Doesn't feel able to do more.  Decreased motivation, more isolated and less active.   Individual Medical History/ Review of Systems: Changes? :Yes CML dx stable.  Intolerant of heat. Back pain.  Allergies: Penicillins  Current Medications:  Current Outpatient Medications:  .  acetaminophen (TYLENOL) 500 MG tablet, Take 500 mg by mouth every 6 (six) hours as needed for moderate pain., Disp: , Rfl:  .  ibuprofen (ADVIL,MOTRIN) 200 MG tablet, Take 400 mg by mouth every 6 (six) hours as needed for moderate pain., Disp: , Rfl:  .  Multiple Vitamin (MULTIVITAMIN WITH MINERALS) TABS tablet, Take 2 tablets by mouth daily., Disp: , Rfl:  .  venlafaxine XR (EFFEXOR-XR) 75 MG 24 hr capsule, Take 225 mg by mouth daily. , Disp: , Rfl: 0 .  SPRYCEL 100 MG tablet, Take 100 mg by mouth daily., Disp: , Rfl: 1 Medication Side Effects: None  Family Medical/ Social History: Changes? Yes D's marriage breaking up. Son's marriage also chaotic.  Helps keep gkids. Hx of 300mg  Effexor. MENTAL HEALTH EXAM:  There were no vitals taken for this visit.There is no height or weight on file to calculate BMI.  General Appearance: Casual  Eye Contact:  Good  Speech:  Normal Rate  Volume:  Normal  Mood:  Depressed  Affect:  Flat  Thought Process:  Coherent  Orientation:  Full (Time, Place, and Person)  Thought Content: Logical   Suicidal Thoughts:  No  Homicidal Thoughts:  No  Memory:  Recent  Judgement:  Good  Insight:  Good  Psychomotor Activity:  Normal  Concentration:  Concentration: Good  Recall:  Good  Fund of  Knowledge: Good  Language: Good  Akathisia:  No  AIMS (if indicated): not done  Assets:  Communication Skills Desire for Improvement  ADL's:  Intact  Cognition: WNL  Prognosis:  Fair    DIAGNOSES:    ICD-10-CM   1. Major depressive disorder, recurrent episode, moderate (HCC) F33.1   2. Generalized anxiety disorder F41.1     RECOMMENDATIONS: Dealling with a lot of stress.  Tearful over how hard the lives of the kids, gkids really affects her. Recommend psychotherapy.  She feels it's too hard to keep appts bc of the family demands. That would help. Disc options for improving the depression: increase vs augment.  Easiest is increase Effexor to 300mg   Never took Abilify.  Disc SE ADV of med options:  Prefers speed of Abilify 2.5 to 5mg  if needed. Samples given.  Explained nature of depression bc she feels guilty Counseling re: family problems.    Purnell Shoemaker, MD

## 2018-03-17 NOTE — Patient Instructions (Signed)
Abilify aripiprazole 5 mg tablet one half each morning for 1 week and if no improvement then increase to 1 tablet each morning.  Call if you have any side effect concerns

## 2018-03-29 ENCOUNTER — Ambulatory Visit: Payer: BLUE CROSS/BLUE SHIELD | Admitting: Psychiatry

## 2018-04-07 ENCOUNTER — Encounter: Payer: Self-pay | Admitting: Emergency Medicine

## 2018-04-19 ENCOUNTER — Ambulatory Visit: Payer: BLUE CROSS/BLUE SHIELD | Admitting: Psychiatry

## 2018-05-06 ENCOUNTER — Other Ambulatory Visit: Payer: Self-pay

## 2018-05-06 MED ORDER — VENLAFAXINE HCL ER 75 MG PO CP24: 225.0000 mg | ORAL_CAPSULE | Freq: Every day | ORAL | 1 refills | Status: DC

## 2018-08-05 ENCOUNTER — Ambulatory Visit (INDEPENDENT_AMBULATORY_CARE_PROVIDER_SITE_OTHER): Payer: BLUE CROSS/BLUE SHIELD | Admitting: Psychiatry

## 2018-08-05 ENCOUNTER — Encounter: Payer: Self-pay | Admitting: Psychiatry

## 2018-08-05 DIAGNOSIS — F411 Generalized anxiety disorder: Secondary | ICD-10-CM

## 2018-08-05 DIAGNOSIS — F331 Major depressive disorder, recurrent, moderate: Secondary | ICD-10-CM | POA: Diagnosis not present

## 2018-08-05 NOTE — Progress Notes (Signed)
Krystal Price [id number] 07-22-1955 63 y.o.  Subjective:   Patient ID:  Krystal Price is a 63 y.o. (DOB [birth year]) female.  Chief Complaint:  Chief Complaint  Patient presents with  . Follow-up    Medication Management   Last seen March 17, 2018 HPI Krystal Price presents to the office today for follow-up of depression and anxiety  At her last visit she was quite depressed over family issues.  Abilify 2.5 to 5 mg was added to her venlafaxine.  It didn't seem to help at 2.5 and didn''t increase and then stopped.  No SE.  Doing ok this time.  Not persistently depressed like before.  Anxiety has been ok lately.  Will feel it when a lot of grand kids over.  Started exercise on a regular basis esp cardio and it's helped her to feel better with more energy.  Wasn't doing a lot of it before.  Jazzercise.  Sleep variable.  Every 3-4 nights it's poor.  Started relief factor (tumeric, OFA, Icarin,  Reservitrol) helped pain a lot.  Will interfere with sleep if takes it late  Past psych meds: Zoloft, brief Abilify 2.5   Review of Systems:  Review of Systems  Musculoskeletal:       Sciatica better  Neurological: Negative for tremors and weakness.  Psychiatric/Behavioral: Negative for agitation, behavioral problems, confusion, decreased concentration, dysphoric mood, hallucinations, self-injury, sleep disturbance and suicidal ideas. The patient is not nervous/anxious and is not hyperactive.     Medications: I have reviewed the patient's current medications.  Current Outpatient Medications  Medication Sig Dispense Refill  . acetaminophen (TYLENOL) 500 MG tablet Take 500 mg by mouth every 6 (six) hours as needed for moderate pain.    [name] ibuprofen (ADVIL,MOTRIN) 200 MG tablet Take 400 mg by mouth every 6 (six) hours as needed for moderate pain.    . Multiple Vitamin (MULTIVITAMIN WITH MINERALS) TABS tablet Take 2 tablets by mouth daily.    [name] venlafaxine XR (EFFEXOR-XR) 75 MG 24 hr  capsule Take 3 capsules (225 mg total) by mouth daily. 270 capsule 1   No current facility-administered medications for this visit.     Medication Side Effects: None  Allergies:  Allergies  Allergen Reactions  . Penicillins Rash    Abdominal rash Has patient had a PCN reaction causing immediate rash, facial/tongue/throat swelling, SOB or lightheadedness with hypotension: Yes Has patient had a PCN reaction causing severe rash involving mucus membranes or skin necrosis: No Has patient had a PCN reaction that required hospitalization No Has patient had a PCN reaction occurring within the last 10 years: No If all of the above answers are "NO", then may proceed with Cephalosporin use.     Past Medical History:  Diagnosis Date  . CML (chronic myelocytic leukemia) ([place])   . CML (chronic myelocytic leukemia) ([place])   . Depression     History reviewed. No pertinent family history.  Social History   Socioeconomic History  . Marital status: Married    Spouse name: Not on file  . Number of children: Not on file  . Years of education: Not on file  . Highest education level: Not on file  Occupational History  . Not on file  Social Needs  . Financial resource strain: Not on file  . Food insecurity:    Worry: Not on file    Inability: Not on file  . Transportation needs:    Medical: Not on file    Non-medical: Not  on file  Tobacco Use  . Smoking status: Never Smoker  . Smokeless tobacco: Never Used  Substance and Sexual Activity  . Alcohol use: No  . Drug use: No  . Sexual activity: Not on file  Lifestyle  . Physical activity:    Days per week: Not on file    Minutes per session: Not on file  . Stress: Not on file  Relationships  . Social connections:    Talks on phone: Not on file    Gets together: Not on file    Attends religious service: Not on file    Active member of club or organization: Not on file    Attends meetings of clubs or organizations: Not on file     Relationship status: Not on file  . Intimate partner violence:    Fear of current or ex partner: Not on file    Emotionally abused: Not on file    Physically abused: Not on file    Forced sexual activity: Not on file  Other Topics Concern  . Not on file  Social History Narrative  . Not on file    Past Medical History, Surgical history, Social history, and Family history were reviewed and updated as appropriate.   Please see review of systems for further details on the patient's review from today.   Objective:   Physical Exam:  There were no vitals taken for this visit.  Physical Exam Constitutional:      General: She is not in acute distress.    Appearance: She is well-developed.  Musculoskeletal:        General: No deformity.  Neurological:     Mental Status: She is alert and oriented to person, place, and time.     Motor: No tremor.     Coordination: Coordination normal.     Gait: Gait normal.  Psychiatric:        Attention and Perception: Attention and perception normal. She is attentive.        Mood and Affect: Mood normal. Mood is not anxious or depressed. Affect is not labile, blunt, angry or inappropriate.        Speech: Speech normal.        Behavior: Behavior normal.        Thought Content: Thought content normal. Thought content does not include homicidal or suicidal ideation. Thought content does not include homicidal or suicidal plan.        Cognition and Memory: Cognition normal.        Judgment: Judgment normal.     Comments: Insight is good.     Lab Review:     Component Value Date/Time   NA 134 (L) 01/18/2016 1234   K 3.7 01/18/2016 1234   CL 101 01/18/2016 1234   CO2 25 01/18/2016 1234   GLUCOSE 105 (H) 01/18/2016 1234   BUN 14 01/18/2016 1234   CREATININE 0.65 01/18/2016 1234   CALCIUM 8.4 (L) 01/18/2016 1234   PROT 7.3 01/18/2016 1234   ALBUMIN 3.9 01/18/2016 1234   AST 88 (H) 01/18/2016 1234   ALT 107 (H) 01/18/2016 1234   ALKPHOS 90  01/18/2016 1234   BILITOT 0.7 01/18/2016 1234   GFRNONAA >60 01/18/2016 1234   GFRAA >60 01/18/2016 1234       Component Value Date/Time   WBC 8.5 01/18/2016 1457   RBC 3.34 (L) 01/18/2016 1457   HGB 10.3 (L) 01/18/2016 1457   HCT 30.7 (L) 01/18/2016 1457   PLT 182 01/18/2016  1457   MCV 91.9 01/18/2016 1457   MCH 30.8 01/18/2016 1457   MCHC 33.6 01/18/2016 1457   RDW 13.8 01/18/2016 1457   LYMPHSABS 1.2 01/18/2016 1457   MONOABS 0.9 01/18/2016 1457   EOSABS 0.0 01/18/2016 1457   BASOSABS 0.0 01/18/2016 1457    No results found for: POCLITH, LITHIUM   No results found for: PHENYTOIN, PHENOBARB, VALPROATE, CBMZ   .res Assessment: Plan:    Major depressive disorder, recurrent episode, moderate (HCC)  Generalized anxiety disorder   Doing well without the Abilify.  Consider retrial later if needed at a higher dosage like 5 mg.  No med changes.  Supportive therapy on stressors.  FU 6 mos.  Lynder Parents, MD, DFAPA  Please see After Visit Summary for patient specific instructions.  Future Appointments  Date Time Provider [place]  08/05/2018 10:15 AM Cottle, Billey Co., MD [facility] None    No orders of the defined types were placed in this encounter.     -------------------------------

## 2018-10-21 ENCOUNTER — Other Ambulatory Visit: Payer: Self-pay | Admitting: Psychiatry

## 2019-02-03 ENCOUNTER — Ambulatory Visit (INDEPENDENT_AMBULATORY_CARE_PROVIDER_SITE_OTHER): Payer: BC Managed Care – PPO | Admitting: Psychiatry

## 2019-02-03 ENCOUNTER — Encounter: Payer: Self-pay | Admitting: Psychiatry

## 2019-02-03 ENCOUNTER — Other Ambulatory Visit: Payer: Self-pay

## 2019-02-03 DIAGNOSIS — F331 Major depressive disorder, recurrent, moderate: Secondary | ICD-10-CM | POA: Diagnosis not present

## 2019-02-03 DIAGNOSIS — F411 Generalized anxiety disorder: Secondary | ICD-10-CM | POA: Diagnosis not present

## 2019-02-03 MED ORDER — VENLAFAXINE HCL ER 75 MG PO CP24: 225.0000 mg | ORAL_CAPSULE | Freq: Every day | ORAL | 1 refills | Status: DC

## 2019-02-03 NOTE — Progress Notes (Signed)
EVANGELENE NICCUM [id number] 02-04-1956 63 y.o.  Subjective:   Patient ID:  Krystal Price is a 63 y.o. (DOB 1955-10-18) female.  Chief Complaint:  Chief Complaint  Patient presents with  . Follow-up    Medication Management  . Depression    Medication Management    Depression        Associated symptoms include no decreased concentration and no suicidal ideas.  Hamdi L Ramser presents to the office today for follow-up of depression and anxiety  Last seen in February.  No meds were changed.  Life is stressful and some anxiety but manageable.  Not many days of depression.  Busy helps her.  Some anxiety and anger over what's going on.  Son lives with her with 4 kids 4 days a week.  Other parent is a source of problem and stress.  Stress home schooling.  Son [name] sleeps in AM and works nights creating stress.  Satisfied with meds.  Doing ok this time.  Not persistently depressed like before.    Started exercise on a regular basis esp cardio and it's helped her to feel better with more energy.  Wasn't doing a lot of it before.  Sleep variable.  Every 3-4 nights it's poor.  Started relief factor (tumeric, OFA, Icarin,  Reservitrol) helped pain a lot.  Will interfere with sleep if takes it late  Health is better.  Past psych meds: Zoloft, brief Abilify 2.5 NR, Effexor 225  Review of Systems:  Review of Systems  Musculoskeletal:       Sciatica better  Neurological: Negative for tremors and weakness.  Psychiatric/Behavioral: Positive for depression. Negative for agitation, behavioral problems, confusion, decreased concentration, dysphoric mood, hallucinations, self-injury, sleep disturbance and suicidal ideas. The patient is not nervous/anxious and is not hyperactive.     Medications: I have reviewed the patient's current medications.  Current Outpatient Medications  Medication Sig Dispense Refill  . acetaminophen (TYLENOL) 500 MG tablet Take 500 mg by mouth every 6 (six) hours as  needed for moderate pain.    [name] ibuprofen (ADVIL,MOTRIN) 200 MG tablet Take 400 mg by mouth every 6 (six) hours as needed for moderate pain.    . Multiple Vitamin (MULTIVITAMIN WITH MINERALS) TABS tablet Take 2 tablets by mouth daily.    [name] venlafaxine XR (EFFEXOR-XR) 75 MG 24 hr capsule Take 3 capsules (225 mg total) by mouth daily. 270 capsule 1   No current facility-administered medications for this visit.     Medication Side Effects: None  Allergies:  Allergies  Allergen Reactions  . Penicillins Rash    Abdominal rash Has patient had a PCN reaction causing immediate rash, facial/tongue/throat swelling, SOB or lightheadedness with hypotension: Yes Has patient had a PCN reaction causing severe rash involving mucus membranes or skin necrosis: No Has patient had a PCN reaction that required hospitalization No Has patient had a PCN reaction occurring within the last 10 years: No If all of the above answers are "NO", then may proceed with Cephalosporin use.     Past Medical History:  Diagnosis Date  . CML (chronic myelocytic leukemia) ([place])   . CML (chronic myelocytic leukemia) ([place])   . Depression     History reviewed. No pertinent family history.  Social History   Socioeconomic History  . Marital status: Married    Spouse name: Not on file  . Number of children: Not on file  . Years of education: Not on file  . Highest education level: Not on file  Occupational History  . Not on file  Social Needs  . Financial resource strain: Not on file  . Food insecurity    Worry: Not on file    Inability: Not on file  . Transportation needs    Medical: Not on file    Non-medical: Not on file  Tobacco Use  . Smoking status: Never Smoker  . Smokeless tobacco: Never Used  Substance and Sexual Activity  . Alcohol use: No  . Drug use: No  . Sexual activity: Not on file  Lifestyle  . Physical activity    Days per week: Not on file    Minutes per session: Not on file  . Stress:  Not on file  Relationships  . Social Herbalist on phone: Not on file    Gets together: Not on file    Attends religious service: Not on file    Active member of club or organization: Not on file    Attends meetings of clubs or organizations: Not on file    Relationship status: Not on file  . Intimate partner violence    Fear of current or ex partner: Not on file    Emotionally abused: Not on file    Physically abused: Not on file    Forced sexual activity: Not on file  Other Topics Concern  . Not on file  Social History Narrative  . Not on file    Past Medical History, Surgical history, Social history, and Family history were reviewed and updated as appropriate.   Please see review of systems for further details on the patient's review from today.   Objective:   Physical Exam:  There were no vitals taken for this visit.  Physical Exam Constitutional:      General: She is not in acute distress.    Appearance: She is well-developed.  Musculoskeletal:        General: No deformity.  Neurological:     Mental Status: She is alert and oriented to person, place, and time.     Motor: No tremor.     Coordination: Coordination normal.     Gait: Gait normal.  Psychiatric:        Attention and Perception: Attention and perception normal. She is attentive.        Mood and Affect: Mood normal. Mood is not anxious or depressed. Affect is not labile, blunt, angry or inappropriate.        Speech: Speech normal.        Behavior: Behavior normal.        Thought Content: Thought content normal. Thought content does not include homicidal or suicidal ideation. Thought content does not include homicidal or suicidal plan.        Cognition and Memory: Cognition normal.        Judgment: Judgment normal.     Comments: Insight is good.     Lab Review:     Component Value Date/Time   NA 134 (L) 01/18/2016 1234   K 3.7 01/18/2016 1234   CL 101 01/18/2016 1234   CO2 25 01/18/2016  1234   GLUCOSE 105 (H) 01/18/2016 1234   BUN 14 01/18/2016 1234   CREATININE 0.65 01/18/2016 1234   CALCIUM 8.4 (L) 01/18/2016 1234   PROT 7.3 01/18/2016 1234   ALBUMIN 3.9 01/18/2016 1234   AST 88 (H) 01/18/2016 1234   ALT 107 (H) 01/18/2016 1234   ALKPHOS 90 01/18/2016 1234   BILITOT 0.7 01/18/2016 1234  GFRNONAA >60 01/18/2016 1234   GFRAA >60 01/18/2016 1234       Component Value Date/Time   WBC 8.5 01/18/2016 1457   RBC 3.34 (L) 01/18/2016 1457   HGB 10.3 (L) 01/18/2016 1457   HCT 30.7 (L) 01/18/2016 1457   PLT 182 01/18/2016 1457   MCV 91.9 01/18/2016 1457   MCH 30.8 01/18/2016 1457   MCHC 33.6 01/18/2016 1457   RDW 13.8 01/18/2016 1457   LYMPHSABS 1.2 01/18/2016 1457   MONOABS 0.9 01/18/2016 1457   EOSABS 0.0 01/18/2016 1457   BASOSABS 0.0 01/18/2016 1457    No results found for: POCLITH, LITHIUM   No results found for: PHENYTOIN, PHENOBARB, VALPROATE, CBMZ   .res Assessment: Plan:    Generalized anxiety disorder  Major depressive disorder, recurrent episode, moderate (HCC)  Overall mood stable and she's satisfied.  Concerned about world and social issues.    Doing well without the Abilify.  Consider retrial later if needed at a higher dosage like 5 mg.  No med changes.  Supportive therapy on stressors.  FU 6 mos.  Lynder Parents, MD, DFAPA  Please see After Visit Summary for patient specific instructions.  No future appointments.  No orders of the defined types were placed in this encounter.     -------------------------------

## 2019-06-22 ENCOUNTER — Other Ambulatory Visit: Payer: Self-pay | Admitting: Sports Medicine

## 2019-06-22 DIAGNOSIS — R52 Pain, unspecified: Secondary | ICD-10-CM

## 2019-07-17 ENCOUNTER — Other Ambulatory Visit: Payer: Managed Care, Other (non HMO)

## 2019-08-01 ENCOUNTER — Ambulatory Visit: Payer: BC Managed Care – PPO | Admitting: Psychiatry

## 2019-09-29 ENCOUNTER — Other Ambulatory Visit: Payer: Self-pay | Admitting: Psychiatry

## 2019-10-12 ENCOUNTER — Ambulatory Visit (INDEPENDENT_AMBULATORY_CARE_PROVIDER_SITE_OTHER): Payer: BC Managed Care – PPO | Admitting: Psychiatry

## 2019-10-12 ENCOUNTER — Encounter: Payer: Self-pay | Admitting: Psychiatry

## 2019-10-12 ENCOUNTER — Other Ambulatory Visit: Payer: Self-pay

## 2019-10-12 DIAGNOSIS — F3341 Major depressive disorder, recurrent, in partial remission: Secondary | ICD-10-CM

## 2019-10-12 DIAGNOSIS — F411 Generalized anxiety disorder: Secondary | ICD-10-CM

## 2019-10-12 MED ORDER — VENLAFAXINE HCL ER 75 MG PO CP24: 225.0000 mg | ORAL_CAPSULE | Freq: Every day | ORAL | 3 refills | Status: DC

## 2019-10-12 NOTE — Progress Notes (Signed)
AREION PROTZMAN [id number] 1955/11/22 64 y.o.  Subjective:   Patient ID:  Krystal Price is a 64 y.o. (DOB 21-Sep-1955) female.  Chief Complaint:  Chief Complaint  Patient presents with  . Follow-up    anxiety and mood    Depression        Associated symptoms include no decreased concentration and no suicidal ideas.  Nashley L Price presents to the office today for follow-up of depression and anxiety  Last seen August 2020.  No meds were changed.  She was doing well off of Abilify.  October 12, 2019 appointment the following is noted: Still tolerating and benefitting from venlafaxine XR 225 mg daily. Depression generally under control.  Occ brif spells of it.  Anxiety is not too bad considering stressors.   H health is good.  Sleep is unchanged with occ problems recurring.  Enough sleep with sleeper. Life is stressful and some anxiety but manageable.  Not many days of depression.  Busy helps her.  Some anxiety and anger over what's going on.  Son lives with her with 4 kids 4 days a week.  Other parent is a source of problem and stress.  Stress home schooling.  Son Krystal Price sleeps in AM and works nights creating stress.  Satisfied with meds.  Started relief factor (tumeric, OFA, Icarin,  Reservitrol) helped pain a lot.  Will interfere with sleep if takes it late  Health is better.  Past psych meds: Zoloft, brief Abilify 2.5 NR, Effexor 225  Review of Systems:  Review of Systems  Musculoskeletal:       Sciatica better  Neurological: Negative for tremors and weakness.  Psychiatric/Behavioral: Positive for depression. Negative for agitation, behavioral problems, confusion, decreased concentration, dysphoric mood, hallucinations, self-injury, sleep disturbance and suicidal ideas. The patient is not nervous/anxious and is not hyperactive.     Medications: I have reviewed the patient's current medications.  Current Outpatient Medications  Medication Sig Dispense Refill  . acetaminophen  (TYLENOL) 500 MG tablet Take 500 mg by mouth every 6 (six) hours as needed for moderate pain.    [name] ibuprofen (ADVIL,MOTRIN) 200 MG tablet Take 400 mg by mouth every 6 (six) hours as needed for moderate pain.    . Multiple Vitamin (MULTIVITAMIN WITH MINERALS) TABS tablet Take 2 tablets by mouth daily.    [name] venlafaxine XR (EFFEXOR-XR) 75 MG 24 hr capsule TAKE 3 CAPSULES BY MOUTH EVERY DAY 270 capsule 0   No current facility-administered medications for this visit.    Medication Side Effects: None  Allergies:  Allergies  Allergen Reactions  . Penicillins Rash    Abdominal rash Has patient had a PCN reaction causing immediate rash, facial/tongue/throat swelling, SOB or lightheadedness with hypotension: Yes Has patient had a PCN reaction causing severe rash involving mucus membranes or skin necrosis: No Has patient had a PCN reaction that required hospitalization No Has patient had a PCN reaction occurring within the last 10 years: No If all of the above answers are "NO", then may proceed with Cephalosporin use.     Past Medical History:  Diagnosis Date  . CML (chronic myelocytic leukemia) ([place])   . CML (chronic myelocytic leukemia) ([place])   . Depression     History reviewed. No pertinent family history.  Social History   Socioeconomic History  . Marital status: Married    Spouse name: Not on file  . Number of children: Not on file  . Years of education: Not on file  . Highest education  level: Not on file  Occupational History  . Not on file  Tobacco Use  . Smoking status: Never Smoker  . Smokeless tobacco: Never Used  Substance and Sexual Activity  . Alcohol use: No  . Drug use: No  . Sexual activity: Not on file  Other Topics Concern  . Not on file  Social History Narrative  . Not on file   Social Determinants of Health   Financial Resource Strain:   . Difficulty of Paying Living Expenses:   Food Insecurity:   . Worried About Charity fundraiser in the Last  Year:   . Arboriculturist in the Last Year:   Transportation Needs:   . Film/video editor (Medical):   [name] Lack of Transportation (Non-Medical):   Physical Activity:   . Days of Exercise per Week:   . Minutes of Exercise per Session:   Stress:   . Feeling of Stress :   Social Connections:   . Frequency of Communication with Friends and Family:   . Frequency of Social Gatherings with Friends and Family:   . Attends Religious Services:   . Active Member of Clubs or Organizations:   . Attends Archivist Meetings:   [name] Marital Status:   Intimate Partner Violence:   . Fear of Current or Ex-Partner:   . Emotionally Abused:   [name] Physically Abused:   . Sexually Abused:     Past Medical History, Surgical history, Social history, and Family history were reviewed and updated as appropriate.   Please see review of systems for further details on the patient's review from today.   Objective:   Physical Exam:  There were no vitals taken for this visit.  Physical Exam Constitutional:      General: She is not in acute distress.    Appearance: She is well-developed.  Musculoskeletal:        General: No deformity.  Neurological:     Mental Status: She is alert and oriented to person, place, and time.     Motor: No tremor.     Coordination: Coordination normal.     Gait: Gait normal.  Psychiatric:        Attention and Perception: Attention and perception normal. She is attentive.        Mood and Affect: Mood normal. Mood is not anxious or depressed. Affect is not labile, blunt, angry or inappropriate.        Speech: Speech normal.        Behavior: Behavior normal.        Thought Content: Thought content normal. Thought content does not include homicidal or suicidal ideation. Thought content does not include homicidal or suicidal plan.        Cognition and Memory: Cognition normal.        Judgment: Judgment normal.     Comments: Insight is good.     Lab Review:      Component Value Date/Time   NA 134 (L) 01/18/2016 1234   K 3.7 01/18/2016 1234   CL 101 01/18/2016 1234   CO2 25 01/18/2016 1234   GLUCOSE 105 (H) 01/18/2016 1234   BUN 14 01/18/2016 1234   CREATININE 0.65 01/18/2016 1234   CALCIUM 8.4 (L) 01/18/2016 1234   PROT 7.3 01/18/2016 1234   ALBUMIN 3.9 01/18/2016 1234   AST 88 (H) 01/18/2016 1234   ALT 107 (H) 01/18/2016 1234   ALKPHOS 90 01/18/2016 1234   BILITOT 0.7 01/18/2016 1234  GFRNONAA >60 01/18/2016 1234   GFRAA >60 01/18/2016 1234       Component Value Date/Time   WBC 8.5 01/18/2016 1457   RBC 3.34 (L) 01/18/2016 1457   HGB 10.3 (L) 01/18/2016 1457   HCT 30.7 (L) 01/18/2016 1457   PLT 182 01/18/2016 1457   MCV 91.9 01/18/2016 1457   MCH 30.8 01/18/2016 1457   MCHC 33.6 01/18/2016 1457   RDW 13.8 01/18/2016 1457   LYMPHSABS 1.2 01/18/2016 1457   MONOABS 0.9 01/18/2016 1457   EOSABS 0.0 01/18/2016 1457   BASOSABS 0.0 01/18/2016 1457    No results found for: POCLITH, LITHIUM   No results found for: PHENYTOIN, PHENOBARB, VALPROATE, CBMZ   .res Assessment: Plan:    Generalized anxiety disorder  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  Concerned about world and social issues.    Doing well without the Abilify.  Consider retrial later if needed at a higher dosage like 5 mg.  No med changes. Continue venlafaxine XR 225 mg daily.  Supportive therapy on stressors.  FU 1 year or sooner prn  Lynder Parents, MD, DFAPA  Please see After Visit Summary for patient specific instructions.  No future appointments.  No orders of the defined types were placed in this encounter.     -------------------------------

## 2019-10-24 ENCOUNTER — Other Ambulatory Visit: Payer: Self-pay | Admitting: Psychiatry

## 2019-10-24 DIAGNOSIS — F3341 Major depressive disorder, recurrent, in partial remission: Secondary | ICD-10-CM

## 2019-10-24 DIAGNOSIS — F411 Generalized anxiety disorder: Secondary | ICD-10-CM

## 2020-10-02 ENCOUNTER — Ambulatory Visit (INDEPENDENT_AMBULATORY_CARE_PROVIDER_SITE_OTHER): Payer: BC Managed Care – PPO | Admitting: Psychiatry

## 2020-10-02 ENCOUNTER — Encounter: Payer: Self-pay | Admitting: Psychiatry

## 2020-10-02 ENCOUNTER — Other Ambulatory Visit: Payer: Self-pay

## 2020-10-02 DIAGNOSIS — F411 Generalized anxiety disorder: Secondary | ICD-10-CM | POA: Diagnosis not present

## 2020-10-02 DIAGNOSIS — F3341 Major depressive disorder, recurrent, in partial remission: Secondary | ICD-10-CM | POA: Diagnosis not present

## 2020-10-02 MED ORDER — VENLAFAXINE HCL ER 75 MG PO CP24: 225.0000 mg | ORAL_CAPSULE | Freq: Every day | ORAL | 3 refills | Status: DC

## 2020-10-02 NOTE — Progress Notes (Signed)
Krystal Price [id number] 10/25/1955 65 y.o.  Subjective:   Patient ID:  Krystal Price is a 65 y.o. (DOB 27-Sep-1955) female.  Chief Complaint:  Chief Complaint  Patient presents with  . Follow-up  . Generalized anxiety disorder    Depression        Associated symptoms include no decreased concentration, no fatigue and no suicidal ideas.  Krystal Price presents to the office today for follow-up of depression and anxiety  Last seen August 2020.  No meds were changed.  She was doing well off of Abilify.  October 12, 2019 appointment the following is noted: Still tolerating and benefitting from venlafaxine XR 225 mg daily. Depression generally under control.  Occ brif spells of it.  Anxiety is not too bad considering stressors.   H health is good.  Sleep is unchanged with occ problems recurring.  Enough sleep with sleeper. Life is stressful and some anxiety but manageable.  Not many days of depression.  Busy helps her.  Some anxiety and anger over what's going on.  Son lives with her with 4 kids 4 days a week.  Other parent is a source of problem and stress.  Stress home schooling.  Son Krystal Price sleeps in AM and works nights creating stress.  Satisfied with meds. Pllan no med changes  4//20/2022 appt noted: Overall Ok with ongoing family stressors. Vitamin D level 30 and felt better with level up to 80 with better energy. Health good.   [name] also healthy. No problems with meds. Patient reports stable mood and denies depressed or irritable moods.  Patient denies any recent difficulty with anxiety.  Patient denies difficulty with sleep initiation or maintenance about 2/3 of the time.  [name] snores and will wake her. Denies appetite disturbance.  Patient reports that energy and motivation have been good.  Patient denies any difficulty with concentration.  Patient denies any suicidal ideation.  Started relief factor (tumeric, OFA, Icarin,  Reservitrol) helped pain a lot.  Will interfere  with sleep if takes it late  Health is better.  Past psych meds: Zoloft, brief Abilify 2.5 NR, Effexor 225  Review of Systems:  Review of Systems  Constitutional: Negative for fatigue.  Musculoskeletal:       Sciatica better  Neurological: Negative for tremors and weakness.  Psychiatric/Behavioral: Positive for depression. Negative for agitation, behavioral problems, confusion, decreased concentration, dysphoric mood, hallucinations, self-injury, sleep disturbance and suicidal ideas. The patient is not nervous/anxious and is not hyperactive.     Medications: I have reviewed the patient's current medications.  Current Outpatient Medications  Medication Sig Dispense Refill  . acetaminophen (TYLENOL) 500 MG tablet Take 500 mg by mouth every 6 (six) hours as needed for moderate pain.    [name] estradiol (CLIMARA - DOSED IN MG/24 HR) 0.1 mg/24hr patch Place 0.1 mg onto the skin once a week.    [name] ibuprofen (ADVIL,MOTRIN) 200 MG tablet Take 400 mg by mouth every 6 (six) hours as needed for moderate pain.    . Multiple Vitamin (MULTIVITAMIN WITH MINERALS) TABS tablet Take 2 tablets by mouth daily.    . Progesterone 100 MG SUPP Take by mouth.    . venlafaxine XR (EFFEXOR-XR) 75 MG 24 hr capsule Take 3 capsules (225 mg total) by mouth daily. 270 capsule 3   No current facility-administered medications for this visit.    Medication Side Effects: None  Allergies:  Allergies  Allergen Reactions  . Penicillins Rash    Abdominal rash Has  patient had a PCN reaction causing immediate rash, facial/tongue/throat swelling, SOB or lightheadedness with hypotension: Yes Has patient had a PCN reaction causing severe rash involving mucus membranes or skin necrosis: No Has patient had a PCN reaction that required hospitalization No Has patient had a PCN reaction occurring within the last 10 years: No If all of the above answers are "NO", then may proceed with Cephalosporin use.     Past Medical History:   Diagnosis Date  . CML (chronic myelocytic leukemia) ([place])   . CML (chronic myelocytic leukemia) ([place])   . Depression     History reviewed. No pertinent family history.  Social History   Socioeconomic History  . Marital status: Married    Spouse name: Not on file  . Number of children: Not on file  . Years of education: Not on file  . Highest education level: Not on file  Occupational History  . Not on file  Tobacco Use  . Smoking status: Never Smoker  . Smokeless tobacco: Never Used  Substance and Sexual Activity  . Alcohol use: No  . Drug use: No  . Sexual activity: Not on file  Other Topics Concern  . Not on file  Social History Narrative  . Not on file   Social Determinants of Health   Financial Resource Strain: Not on file  Food Insecurity: Not on file  Transportation Needs: Not on file  Physical Activity: Not on file  Stress: Not on file  Social Connections: Not on file  Intimate Partner Violence: Not on file    Past Medical History, Surgical history, Social history, and Family history were reviewed and updated as appropriate.   Please see review of systems for further details on the patient's review from today.   Objective:   Physical Exam:  There were no vitals taken for this visit.  Physical Exam Constitutional:      General: She is not in acute distress.    Appearance: She is well-developed.  Musculoskeletal:        General: No deformity.  Neurological:     Mental Status: She is alert and oriented to person, place, and time.     Motor: No tremor.     Coordination: Coordination normal.     Gait: Gait normal.  Psychiatric:        Attention and Perception: Attention and perception normal. She is attentive.        Mood and Affect: Mood normal. Mood is not anxious or depressed. Affect is not labile, blunt, angry or inappropriate.        Speech: Speech normal.        Behavior: Behavior normal. Behavior is not slowed.        Thought Content:  Thought content normal. Thought content does not include homicidal or suicidal ideation. Thought content does not include homicidal or suicidal plan.        Cognition and Memory: Cognition normal.        Judgment: Judgment normal.     Comments: Insight is good.     Lab Review:     Component Value Date/Time   NA 134 (L) 01/18/2016 1234   K 3.7 01/18/2016 1234   CL 101 01/18/2016 1234   CO2 25 01/18/2016 1234   GLUCOSE 105 (H) 01/18/2016 1234   BUN 14 01/18/2016 1234   CREATININE 0.65 01/18/2016 1234   CALCIUM 8.4 (L) 01/18/2016 1234   PROT 7.3 01/18/2016 1234   ALBUMIN 3.9 01/18/2016 1234   AST  88 (H) 01/18/2016 1234   ALT 107 (H) 01/18/2016 1234   ALKPHOS 90 01/18/2016 1234   BILITOT 0.7 01/18/2016 1234   GFRNONAA >60 01/18/2016 1234   GFRAA >60 01/18/2016 1234       Component Value Date/Time   WBC 8.5 01/18/2016 1457   RBC 3.34 (L) 01/18/2016 1457   HGB 10.3 (L) 01/18/2016 1457   HCT 30.7 (L) 01/18/2016 1457   PLT 182 01/18/2016 1457   MCV 91.9 01/18/2016 1457   MCH 30.8 01/18/2016 1457   MCHC 33.6 01/18/2016 1457   RDW 13.8 01/18/2016 1457   LYMPHSABS 1.2 01/18/2016 1457   MONOABS 0.9 01/18/2016 1457   EOSABS 0.0 01/18/2016 1457   BASOSABS 0.0 01/18/2016 1457    No results found for: POCLITH, LITHIUM   No results found for: PHENYTOIN, PHENOBARB, VALPROATE, CBMZ   .res Assessment: Plan:    Generalized anxiety disorder - Plan: venlafaxine XR (EFFEXOR-XR) 75 MG 24 hr capsule  Recurrent major depression in partial remission ([place]) - Plan: venlafaxine XR (EFFEXOR-XR) 75 MG 24 hr capsule  Overall mood stable and she's satisfied.  Concerned about world and social issues.    Doing well without the Abilify.  Consider retrial later if needed at a higher dosage like 5 mg.  No med changes. Continue venlafaxine XR 225 mg daily.  Supportive therapy on stressors.  FU 1 year or sooner prn  Lynder Parents, MD, DFAPA  Please see After Visit Summary for patient  specific instructions.  No future appointments.  No orders of the defined types were placed in this encounter.     -------------------------------

## 2020-10-10 ENCOUNTER — Ambulatory Visit: Payer: BC Managed Care – PPO | Admitting: Psychiatry

## 2021-04-03 ENCOUNTER — Emergency Department (HOSPITAL_BASED_OUTPATIENT_CLINIC_OR_DEPARTMENT_OTHER)
Admission: EM | Admit: 2021-04-03 | Discharge: 2021-04-03 | Disposition: A | Discharge status: Home / Self Care | Payer: BC Managed Care – PPO | Attending: Emergency Medicine | Admitting: Emergency Medicine

## 2021-04-03 ENCOUNTER — Encounter (HOSPITAL_BASED_OUTPATIENT_CLINIC_OR_DEPARTMENT_OTHER): Payer: Self-pay

## 2021-04-03 ENCOUNTER — Other Ambulatory Visit: Payer: Self-pay

## 2021-04-03 DIAGNOSIS — T7840XD Allergy, unspecified, subsequent encounter: Secondary | ICD-10-CM | POA: Insufficient documentation

## 2021-04-03 DIAGNOSIS — L509 Urticaria, unspecified: Secondary | ICD-10-CM

## 2021-04-03 DIAGNOSIS — L500 Allergic urticaria: Secondary | ICD-10-CM | POA: Diagnosis not present

## 2021-04-03 DIAGNOSIS — T7840XA Allergy, unspecified, initial encounter: Secondary | ICD-10-CM

## 2021-04-03 MED ORDER — PREDNISONE 20 MG PO TABS: ORAL_TABLET | ORAL | 0 refills | Status: DC

## 2021-04-03 MED ORDER — FAMOTIDINE 20 MG PO TABS: 20.0000 mg | ORAL_TABLET | Freq: Two times a day (BID) | ORAL | 0 refills | Status: DC

## 2021-04-03 MED ORDER — EPINEPHRINE 0.3 MG/0.3ML IJ SOAJ: 0.3000 mg | INTRAMUSCULAR | 0 refills | Status: AC | PRN

## 2021-04-03 MED ORDER — DIPHENHYDRAMINE HCL 25 MG PO CAPS
25.0000 mg | ORAL_CAPSULE | Freq: Once | ORAL | Status: AC
  Administered 2021-04-03: 25 mg via ORAL
  Filled 2021-04-03: qty 1

## 2021-04-03 MED ORDER — PREDNISONE 50 MG PO TABS
60.0000 mg | ORAL_TABLET | Freq: Once | ORAL | Status: AC
  Administered 2021-04-03: 60 mg via ORAL
  Filled 2021-04-03: qty 1

## 2021-04-03 MED ORDER — FAMOTIDINE 20 MG PO TABS
20.0000 mg | ORAL_TABLET | Freq: Once | ORAL | Status: AC
  Administered 2021-04-03: 20 mg via ORAL
  Filled 2021-04-03: qty 1

## 2021-04-03 NOTE — ED Triage Notes (Signed)
Pt reports 30 min PTA she began noticing pruritis to her hands and feet that spread throughout her body. Pt now notes a red rash to torso and limbs. Pt denies contact with any known allergen, only exposed to cats which have never caused issue before. No respiratory distress or GI upset. Pt states she took 25 mg benadryl PO 15 min PTA.

## 2021-04-03 NOTE — ED Provider Notes (Signed)
[facility] EMERGENCY DEPT Provider Note   CSN: [id number] Arrival date & time: 04/03/21  1038     History Chief Complaint  Patient presents with   Rash   Pruritis    Krystal Price is a 65 y.o. female.  HPI Patient reports that she was feeling well today.  She got a sandwich from [employer] and then went to the pharmacy.  Within 10 minutes of eating the sandwich, she started to get itching of her palms.  She reports that progressed and then her feet started to itch and they both got kind of red and swollen.  From there she developed a red itchy rash over much of her trunk and started feeling tingly.  She reports she had a little bit of cramping in her abdomen.  Patient went to the pharmacy to pick up what she had intended and then went home and took 25 mg of Benadryl.  She reports that her symptoms did improve since taking the Benadryl.  She reports she still has some rash left on her trunk but it is starting to fade and the itching and swelling of her hands is going away.  She denies she had any swelling or tightness around her mouth or throat.  She did not have any difficulty breathing.  She did not feel lightheaded or syncopal.  Patient reports that once before a number of years ago she did have an allergic reaction that was attributed to a chemotherapeutic agent if she was taking for CML.  She had never had another reaction since then.  Patient has been in complete remission from [facility] for many years now.  No known food allergies.    Past Medical History:  Diagnosis Date   CML (chronic myelocytic leukemia) ([place])    CML (chronic myelocytic leukemia) ([place])    Depression     Patient Active Problem List   Diagnosis Date Noted   Major depressive disorder, single episode, moderate ([place]) 03/16/2018    Past Surgical History:  Procedure Laterality Date   EXPLORATORY LAPAROTOMY       OB History   No obstetric history on file.     No family history on file.  Social  History   Tobacco Use   Smoking status: Never   Smokeless tobacco: Never  Substance Use Topics   Alcohol use: No   Drug use: No    Home Medications Prior to Admission medications   Medication Sig Start Date End Date Taking? Authorizing Provider  estradiol (CLIMARA - DOSED IN MG/24 HR) 0.1 mg/24hr patch Place 0.25 mg onto the skin once a week.   Yes [provider]  Multiple Vitamin (MULTIVITAMIN WITH MINERALS) TABS tablet Take 2 tablets by mouth daily.   Yes [provider]  Progesterone 100 MG SUPP Take by mouth.   Yes [provider]  venlafaxine XR (EFFEXOR-XR) 75 MG 24 hr capsule Take 3 capsules (225 mg total) by mouth daily. 10/02/20  Yes Cottle, Billey Co., MD  acetaminophen (TYLENOL) 500 MG tablet Take 500 mg by mouth every 6 (six) hours as needed for moderate pain.    [provider]  ibuprofen (ADVIL,MOTRIN) 200 MG tablet Take 400 mg by mouth every 6 (six) hours as needed for moderate pain.    [provider]    Allergies    Penicillins  Review of Systems   Review of Systems 10 systems reviewed and negative except as per HPI Physical Exam Updated Vital Signs BP 118/70  Pulse 78   Temp 98.6 F (37 C)   Resp 18   Ht 5\' 5"  (1.651 m)   Wt 56.2 kg   SpO2 99%   BMI 20.63 kg/m   Physical Exam Constitutional:      Comments: Alert nontoxic no respiratory distress.  Clinically well in appearance.  HENT:     Head: Normocephalic and atraumatic.     Comments: No facial or perioral swelling.    Mouth/Throat:     Mouth: Mucous membranes are moist.     Pharynx: Oropharynx is clear.  Eyes:     Extraocular Movements: Extraocular movements intact.     Conjunctiva/sclera: Conjunctivae normal.  Cardiovascular:     Rate and Rhythm: Normal rate and regular rhythm.  Pulmonary:     Effort: Pulmonary effort is normal.     Breath sounds: Normal breath sounds.  Abdominal:     General: There is no distension.     Palpations: Abdomen  is soft.     Tenderness: There is no abdominal tenderness. There is no guarding.  Musculoskeletal:        General: No swelling or tenderness. Normal range of motion.     Right lower leg: No edema.     Left lower leg: No edema.  Skin:    General: Skin is warm and dry.     Findings: Rash present.     Comments: Patient has some residual urticarial rash on the trunk.  See attached images.  No facial rash or swelling.  No perioral swelling or rash.  Hands and feet are no longer swollen.  Neurological:     General: No focal deficit present.     Mental Status: She is oriented to person, place, and time.     Motor: No weakness.     Coordination: Coordination normal.  Psychiatric:        Mood and Affect: Mood normal.     ED Results / Procedures / Treatments   Labs (all labs ordered are listed, but only abnormal results are displayed) Labs Reviewed - No data to display  EKG None  Radiology No results found.  Procedures Procedures   Medications Ordered in ED Medications  predniSONE (DELTASONE) tablet 60 mg (has no administration in time range)  diphenhydrAMINE (BENADRYL) capsule 25 mg (has no administration in time range)  famotidine (PEPCID) tablet 20 mg (has no administration in time range)    ED Course  I have reviewed the triage vital signs and the nursing notes.  Pertinent labs & imaging results that were available during my care of the patient were reviewed by me and considered in my medical decision making (see chart for details).    MDM Rules/Calculators/A&P                           Patient had acute onset of allergic reaction within 10 minutes of eating a sandwich with multiple ingredients from [employer].  Patient did not have associated shortness of breath or airway swelling.  She reports she has some mild GI symptoms.  Pictures and description suggest fairly extensive reaction.  She has responded very well to Benadryl taken at home.  She has some residual rash on  the trunk but has now resolved redness and swelling of the hands does not have any GI symptoms or other systemic symptoms.  At this time appears stable continue with oral medications.  We will provide a dose of prednisone, additional dose of  Benadryl and Pepcid in the emergency department.  Return precautions, precautions regarding food consumption and carrying an EpiPen for evidence of systemic reaction, reviewed in the emergency department.  Recommend follow-up with PCP. Final Clinical Impression(s) / ED Diagnoses Final diagnoses:  Allergic reaction, initial encounter  Urticaria    Rx / DC Orders ED Discharge Orders     None        Charlesetta Shanks, MD 04/03/21 1203

## 2021-04-03 NOTE — Discharge Instructions (Addendum)
1.  You have been given a dose of prednisone today in the emergency department.  Continue tomorrow as per your prescription.  Take Benadryl 1 to 2 tablets every 4-6 hours for rash and itching.  Take Pepcid twice daily for the next 5 to 7 days.  Carry your EpiPen in the event you have a more severe reaction. 2.  Follow-up with your doctor for recheck within 3 to 5 days.  Return to the emergency department if you have a rebound of worsening symptoms.

## 2021-10-02 ENCOUNTER — Encounter: Payer: Self-pay | Admitting: Psychiatry

## 2021-10-02 ENCOUNTER — Ambulatory Visit (INDEPENDENT_AMBULATORY_CARE_PROVIDER_SITE_OTHER): Payer: BC Managed Care – PPO | Admitting: Psychiatry

## 2021-10-02 DIAGNOSIS — F3341 Major depressive disorder, recurrent, in partial remission: Secondary | ICD-10-CM | POA: Diagnosis not present

## 2021-10-02 DIAGNOSIS — F411 Generalized anxiety disorder: Secondary | ICD-10-CM

## 2021-10-02 MED ORDER — VENLAFAXINE HCL ER 75 MG PO CP24: 225.0000 mg | ORAL_CAPSULE | Freq: Every day | ORAL | 3 refills | Status: DC

## 2021-10-02 NOTE — Progress Notes (Signed)
Krystal Price ?[id number] ?April 05, 1956 ?66 y.o. ? ?Subjective:  ? ?Patient ID:  Krystal Price is a 66 y.o. (DOB 05-29-1956) female. ? ?Chief Complaint:  ?Chief Complaint  ?Patient presents with  ? Follow-up  ? ? ?Depression ?       Associated symptoms include no decreased concentration, no fatigue and no suicidal ideas. ?[name] presents to the office today for follow-up of depression and anxiety ? ?Last seen August 2020.  No meds were changed.  She was doing well off of Abilify. ? ?October 12, 2019 appointment the following is noted: ?Still tolerating and benefitting from venlafaxine XR 225 mg daily. ?Depression generally under control.  Occ brif spells of it.  Anxiety is not too bad considering stressors.   ?H health is good.  Sleep is unchanged with occ problems recurring.  Enough sleep with sleeper. ?Life is stressful and some anxiety but manageable.  Not many days of depression.  Busy helps her.  Some anxiety and anger over what's going on.  Son lives with her with 4 kids 4 days a week.  Other parent is a source of problem and stress.  Stress home schooling.  Son [name] sleeps in AM and works nights creating stress.  Satisfied with meds. ?Pllan no med changes ? ?4//20/2022 appt noted: ?Overall Ok with ongoing family stressors. ?Vitamin D level 30 and felt better with level up to 80 with better energy. ?Health good.   ?[name] also healthy. ?No problems with meds. ?Patient reports stable mood and denies depressed or irritable moods.  Patient denies any recent difficulty with anxiety.  Patient denies difficulty with sleep initiation or maintenance about 2/3 of the time.  [name] snores and will wake her. Denies appetite disturbance.  Patient reports that energy and motivation have been good.  Patient denies any difficulty with concentration.  Patient denies any suicidal ideation. ?Started relief factor (tumeric, OFA, Icarin,  Reservitrol) helped pain a lot.  Will interfere with sleep if takes it late ?Health is  better. ?Plan: No med changes. Continue venlafaxine XR 225 mg daily. ? ?10/02/21 appt noted: ?Same as last year.  Overall depression and anxiety under control with break through periods.   ? Patient denies difficulty with sleep initiation or maintenance. Denies appetite disturbance.  Patient reports that energy and motivation have been good.  Patient denies any difficulty with concentration.  Patient denies any suicidal ideation. ?Sleep better.  [name] ?Still on venlafaxie XR 225 mg daily. ?Better sleep with progesterone.   ? ? ?Past psych meds: Zoloft, brief Abilify 2.5 NR, Effexor 225 ? ?Review of Systems:  ?Review of Systems  ?Constitutional:  Negative for fatigue.  ?Cardiovascular:  Negative for palpitations.  ?Musculoskeletal:   ?     Sciatica better  ?Neurological:  Negative for tremors and weakness.  ?Psychiatric/Behavioral:  Negative for agitation, behavioral problems, confusion, decreased concentration, dysphoric mood, hallucinations, self-injury, sleep disturbance and suicidal ideas. The patient is not nervous/anxious and is not hyperactive.   ? ?Medications: I have reviewed the patient's current medications. ? ?Current Outpatient Medications  ?Medication Sig Dispense Refill  ? acetaminophen (TYLENOL) 500 MG tablet Take 500 mg by mouth every 6 (six) hours as needed for moderate pain.    ? EPINEPHrine 0.3 mg/0.3 mL IJ SOAJ injection Inject 0.3 mg into the muscle as needed for anaphylaxis. 1 each 0  ? estradiol (CLIMARA - DOSED IN MG/24 HR) 0.1 mg/24hr patch Place 0.25 mg onto the skin once a week.    ?  famotidine (PEPCID) 20 MG tablet Take 1 tablet (20 mg total) by mouth 2 (two) times daily. 10 tablet 0  ? ibuprofen (ADVIL,MOTRIN) 200 MG tablet Take 400 mg by mouth every 6 (six) hours as needed for moderate pain.    ? Multiple Vitamin (MULTIVITAMIN WITH MINERALS) TABS tablet Take 2 tablets by mouth daily.    ? Progesterone 100 MG SUPP Take by mouth.    ? venlafaxine XR (EFFEXOR-XR) 75 MG 24 hr capsule Take 3  capsules (225 mg total) by mouth daily. 270 capsule 3  ? ?No current facility-administered medications for this visit.  ? ? ?Medication Side Effects: None ? ?Allergies:  ?Allergies  ?Allergen Reactions  ? Penicillins Rash  ?  Abdominal rash ?Has patient had a PCN reaction causing immediate rash, facial/tongue/throat swelling, SOB or lightheadedness with hypotension: Yes ?Has patient had a PCN reaction causing severe rash involving mucus membranes or skin necrosis: No ?Has patient had a PCN reaction that required hospitalization No ?Has patient had a PCN reaction occurring within the last 10 years: No ?If all of the above answers are "NO", then may proceed with Cephalosporin use. ?  ? ? ?Past Medical History:  ?Diagnosis Date  ? CML (chronic myelocytic leukemia) ([place])   ? CML (chronic myelocytic leukemia) ([place])   ? Depression   ? ? ?History reviewed. No pertinent family history. ? ?Social History  ? ?Socioeconomic History  ? Marital status: Married  ?  Spouse name: Not on file  ? Number of children: Not on file  ? Years of education: Not on file  ? Highest education level: Not on file  ?Occupational History  ? Not on file  ?Tobacco Use  ? Smoking status: Never  ? Smokeless tobacco: Never  ?Substance and Sexual Activity  ? Alcohol use: No  ? Drug use: No  ? Sexual activity: Not on file  ?Other Topics Concern  ? Not on file  ?Social History Narrative  ? Not on file  ? ?Social Determinants of Health  ? ?Financial Resource Strain: Not on file  ?Food Insecurity: Not on file  ?Transportation Needs: Not on file  ?Physical Activity: Not on file  ?Stress: Not on file  ?Social Connections: Not on file  ?Intimate Partner Violence: Not on file  ? ? ?Past Medical History, Surgical history, Social history, and Family history were reviewed and updated as appropriate.  ? ?Please see review of systems for further details on the patient's review from today.  ? ?Objective:  ? ?Physical Exam:  ?There were no vitals taken for this  visit. ? ?Physical Exam ?Constitutional:   ?   General: She is not in acute distress. ?   Appearance: She is well-developed.  ?Musculoskeletal:     ?   General: No deformity.  ?Neurological:  ?   Mental Status: She is alert and oriented to person, place, and time.  ?   Motor: No tremor.  ?   Coordination: Coordination normal.  ?   Gait: Gait normal.  ?Psychiatric:     ?   Attention and Perception: Attention and perception normal. She is attentive.     ?   Mood and Affect: Mood normal. Mood is not anxious or depressed. Affect is not labile, blunt, angry or inappropriate.     ?   Speech: Speech normal.     ?   Behavior: Behavior normal. Behavior is not slowed.     ?   Thought Content: Thought content normal. Thought content is  not delusional. Thought content does not include homicidal or suicidal ideation. Thought content does not include suicidal plan.     ?   Cognition and Memory: Cognition normal.     ?   Judgment: Judgment normal.  ?   Comments: Insight is good.  ? ? ?Lab Review:  ?   ?Component Value Date/Time  ? NA 134 (L) 01/18/2016 1234  ? K 3.7 01/18/2016 1234  ? CL 101 01/18/2016 1234  ? CO2 25 01/18/2016 1234  ? GLUCOSE 105 (H) 01/18/2016 1234  ? BUN 14 01/18/2016 1234  ? CREATININE 0.65 01/18/2016 1234  ? CALCIUM 8.4 (L) 01/18/2016 1234  ? PROT 7.3 01/18/2016 1234  ? ALBUMIN 3.9 01/18/2016 1234  ? AST 88 (H) 01/18/2016 1234  ? ALT 107 (H) 01/18/2016 1234  ? ALKPHOS 90 01/18/2016 1234  ? BILITOT 0.7 01/18/2016 1234  ? GFRNONAA >60 01/18/2016 1234  ? GFRAA >60 01/18/2016 1234  ? ? ?   ?Component Value Date/Time  ? WBC 8.5 01/18/2016 1457  ? RBC 3.34 (L) 01/18/2016 1457  ? HGB 10.3 (L) 01/18/2016 1457  ? HCT 30.7 (L) 01/18/2016 1457  ? PLT 182 01/18/2016 1457  ? MCV 91.9 01/18/2016 1457  ? MCH 30.8 01/18/2016 1457  ? MCHC 33.6 01/18/2016 1457  ? RDW 13.8 01/18/2016 1457  ? LYMPHSABS 1.2 01/18/2016 1457  ? MONOABS 0.9 01/18/2016 1457  ? EOSABS 0.0 01/18/2016 1457  ? BASOSABS 0.0 01/18/2016 1457  ? ? ?No  results found for: POCLITH, LITHIUM  ? ?No results found for: PHENYTOIN, PHENOBARB, VALPROATE, CBMZ  ? ?.res ?Assessment: Plan:   ? ?Generalized anxiety disorder - Plan: venlafaxine XR (EFFEXOR-XR) 75 MG 24 hr capsu

## 2022-10-06 ENCOUNTER — Ambulatory Visit (INDEPENDENT_AMBULATORY_CARE_PROVIDER_SITE_OTHER): Payer: BC Managed Care – PPO | Admitting: Psychiatry

## 2022-10-06 DIAGNOSIS — Z91199 Patient's noncompliance with other medical treatment and regimen due to unspecified reason: Secondary | ICD-10-CM

## 2022-10-06 NOTE — Progress Notes (Signed)
No show

## 2022-10-09 ENCOUNTER — Other Ambulatory Visit: Payer: Self-pay | Admitting: Psychiatry

## 2022-10-09 DIAGNOSIS — F3341 Major depressive disorder, recurrent, in partial remission: Secondary | ICD-10-CM

## 2022-10-09 DIAGNOSIS — F411 Generalized anxiety disorder: Secondary | ICD-10-CM

## 2022-10-11 NOTE — Telephone Encounter (Signed)
Please call patient to schedule, was a no show for F/U

## 2022-10-12 NOTE — Telephone Encounter (Signed)
Patient is scheduled for 6/5

## 2022-11-18 ENCOUNTER — Ambulatory Visit: Payer: Self-pay | Admitting: Psychiatry

## 2022-11-21 ENCOUNTER — Emergency Department (HOSPITAL_BASED_OUTPATIENT_CLINIC_OR_DEPARTMENT_OTHER): Payer: BC Managed Care – PPO | Admitting: Radiology

## 2022-11-21 ENCOUNTER — Other Ambulatory Visit: Payer: Self-pay

## 2022-11-21 ENCOUNTER — Encounter (HOSPITAL_BASED_OUTPATIENT_CLINIC_OR_DEPARTMENT_OTHER): Payer: Self-pay

## 2022-11-21 ENCOUNTER — Other Ambulatory Visit (HOSPITAL_BASED_OUTPATIENT_CLINIC_OR_DEPARTMENT_OTHER): Payer: Self-pay

## 2022-11-21 ENCOUNTER — Emergency Department (HOSPITAL_BASED_OUTPATIENT_CLINIC_OR_DEPARTMENT_OTHER)
Admission: EM | Admit: 2022-11-21 | Discharge: 2022-11-21 | Disposition: A | Discharge status: Home / Self Care | Payer: BC Managed Care – PPO | Attending: Emergency Medicine | Admitting: Emergency Medicine

## 2022-11-21 DIAGNOSIS — Z1152 Encounter for screening for COVID-19: Secondary | ICD-10-CM | POA: Diagnosis not present

## 2022-11-21 DIAGNOSIS — R059 Cough, unspecified: Secondary | ICD-10-CM | POA: Diagnosis present

## 2022-11-21 DIAGNOSIS — J069 Acute upper respiratory infection, unspecified: Secondary | ICD-10-CM | POA: Insufficient documentation

## 2022-11-21 LAB — RESP PANEL BY RT-PCR (RSV, FLU A&B, COVID)  RVPGX2
Influenza A by PCR: NEGATIVE
Influenza B by PCR: NEGATIVE
Resp Syncytial Virus by PCR: NEGATIVE
SARS Coronavirus 2 by RT PCR: NEGATIVE

## 2022-11-21 MED ORDER — FLUTICASONE PROPIONATE 50 MCG/ACT NA SUSP
2.0000 | Freq: Every day | NASAL | 0 refills | Status: DC
  Filled 2022-11-21: qty 16, 30d supply, fill #0

## 2022-11-21 MED ORDER — ACETAMINOPHEN 500 MG PO TABS
1000.0000 mg | ORAL_TABLET | Freq: Once | ORAL | Status: AC
  Administered 2022-11-21: 1000 mg via ORAL
  Filled 2022-11-21: qty 2

## 2022-11-21 MED ORDER — BENZONATATE 100 MG PO CAPS
100.0000 mg | ORAL_CAPSULE | Freq: Once | ORAL | Status: AC
  Administered 2022-11-21: 100 mg via ORAL
  Filled 2022-11-21: qty 1

## 2022-11-21 MED ORDER — AZITHROMYCIN 250 MG PO TABS
250.0000 mg | ORAL_TABLET | Freq: Every day | ORAL | 0 refills | Status: DC
  Filled 2022-11-21: qty 6, 6d supply, fill #0

## 2022-11-21 MED ORDER — BENZONATATE 100 MG PO CAPS
100.0000 mg | ORAL_CAPSULE | Freq: Three times a day (TID) | ORAL | 0 refills | Status: DC
  Filled 2022-11-21: qty 21, 7d supply, fill #0

## 2022-11-21 NOTE — ED Provider Notes (Signed)
[place] EMERGENCY DEPARTMENT AT [facility] Provider Note   CSN: [id number] Arrival date & time: 11/21/22  0741     History  Chief Complaint  Patient presents with   URI    Krystal Price is a 67 y.o. female.  With PMH of CML in remission, depression who presents with 4 days of dry cough associated with congestion and fatigue.  She developed fever today of 100.4 F.  Her cough has been dry.  No productive cough.  Has also had congestion and clear nasal drainage.  Complains of generalized aches.  No vomiting, no diarrhea, no chest pain, no abdominal pain, no shortness of breath.  Has been in remission from [facility] for many years.  No confusion no rashes reported.  No sick contacts reported.  No history of allergies.  Took ibuprofen prior to arrival for fevers and aches.   URI      Home Medications Prior to Admission medications   Medication Sig Start Date End Date Taking? Authorizing Provider  azithromycin (ZITHROMAX) 250 MG tablet Take 1 tablet (250 mg total) by mouth daily. Take first 2 tablets together, then 1 every day until finished. 11/21/22  Yes Mardene Sayer, MD  benzonatate (TESSALON) 100 MG capsule Take 1 capsule (100 mg total) by mouth every 8 (eight) hours. 11/21/22  Yes Mardene Sayer, MD  fluticasone (FLONASE) 50 MCG/ACT nasal spray Place 2 sprays into both nostrils daily. 11/21/22  Yes Mardene Sayer, MD  acetaminophen (TYLENOL) 500 MG tablet Take 500 mg by mouth every 6 (six) hours as needed for moderate pain.    [provider]  EPINEPHrine 0.3 mg/0.3 mL IJ SOAJ injection Inject 0.3 mg into the muscle as needed for anaphylaxis. 04/03/21   Arby Barrette, MD  estradiol (CLIMARA - DOSED IN MG/24 HR) 0.1 mg/24hr patch Place 0.25 mg onto the skin once a week.    [provider]  famotidine (PEPCID) 20 MG tablet Take 1 tablet (20 mg total) by mouth 2 (two) times daily. 04/03/21   Arby Barrette, MD  ibuprofen (ADVIL,MOTRIN) 200 MG  tablet Take 400 mg by mouth every 6 (six) hours as needed for moderate pain.    [provider]  Multiple Vitamin (MULTIVITAMIN WITH MINERALS) TABS tablet Take 2 tablets by mouth daily.    [provider]  Progesterone 100 MG SUPP Take by mouth.    [provider]  venlafaxine XR (EFFEXOR-XR) 75 MG 24 hr capsule TAKE 3 CAPSULES BY MOUTH DAILY. 10/12/22   Cottle, Steva Ready., MD      Allergies    Penicillins    Review of Systems   Review of Systems  Physical Exam Updated Vital Signs BP 135/75   Pulse 72   Temp (!) 100.4 F (38 C) (Oral)   Resp 17   Ht 5\' 5"  (1.651 m)   Wt 54.4 kg   SpO2 96%   BMI 19.97 kg/m  Physical Exam Constitutional: Alert and oriented. Well appearing and in no distress. Eyes: Conjunctivae are normal. ENT      Head: Normocephalic and atraumatic.      Nose: + congestion.  No drainage      Mouth/Throat: Mucous membranes are moist.  Uvula midline.  Minimal erythema of oropharynx.  No exudate present      Neck: No stridor.  No tripoding, no drooling, no meningismus Cardiovascular: S1, S2, regular rate and rhythm Respiratory: Normal respiratory effort. Breath sounds are normal.  O2 sat 97 on  RA Gastrointestinal: Soft and nondistended Musculoskeletal: Normal range of motion in all extremities. Neurologic: Normal speech and language. No gross focal neurologic deficits are appreciated. Skin: Skin is warm, dry and intact. No rash noted. Psychiatric: Mood and affect are normal. Speech and behavior are normal.  ED Results / Procedures / Treatments   Labs (all labs ordered are listed, but only abnormal results are displayed) Labs Reviewed  RESP PANEL BY RT-PCR (RSV, FLU A&B, COVID)  [id number]    EKG None  Radiology DG Chest 2 View  Result Date: 11/21/2022 CLINICAL DATA:  fever, fatigue, chest congestion EXAM: CHEST - 2 VIEW COMPARISON:  01/18/2016 chest radiograph. FINDINGS: Stable cardiomediastinal silhouette with normal heart size.  No pneumothorax. No pleural effusion. Lungs appear clear, with no acute consolidative airspace disease and no pulmonary edema. IMPRESSION: No active cardiopulmonary disease. Electronically Signed   By: Delbert Phenix M.D.   On: 11/21/2022 09:53    Procedures Procedures    Medications Ordered in ED Medications  acetaminophen (TYLENOL) tablet 1,000 mg (1,000 mg Oral Given 11/21/22 0845)  benzonatate (TESSALON) capsule 100 mg (100 mg Oral Given 11/21/22 0845)    ED Course/ Medical Decision Making/ A&P                             Medical Decision Making  Krystal Price is a 67 y.o. female.  With PMH of CML in remission, depression who presents with 4 days of dry cough associated with congestion and fatigue.    Overall, this is a well appearing patient presenting with mild viral-like symptoms, perhaps secondary to COVID, influenza, or any other non-specific virus specially given high prevalence of disease in surrounding community. Will swab for COVID/influenza/RSV.  All negative today.  Vitals as documented. On exam, normal work of breathing. Speaking in full sentences without difficulty. No respiratory distress and overall non-toxic appearing. No evidence of hypoxia.  Based on the patient's medical screening exam, including their history, vitals, and physical exam, the patient is stable for further outpatient evaluation of their symptoms. There is no evidence of respiratory distress, systemic toxicity, hemodynamic compromise, or emergent medical condition.  Therefore, I do not feel imaging or laboratory work is required beyond the viral swabs and CXR.  Chest x-ray reviewed by me which showed no evidence of pneumonia.  No pneumothorax, no pleural effusion.  Signs, symptoms and clinical appearance are not consistent with meningitis or encephalitis. No risk factors for bacterial endocarditis (like IVDU, indwelling catheter, prosthetic heart valve, ESRD), and no known history of valvular abnormality.  At this time I do not believe this to be pericarditis or myocarditis given predominant respiratory symptoms and no signs of fluid overload.   Results and decision making discussed in depth with the patient. I discussed plan to discharge the patient and the important need for follow up.  Discharging with as needed Tessalon Perles, fluticasone spray and Z-Pak due to increased pertussis in community.  They agree with plan. I discussed strict return precautions with them, which were included in my discharge instructions, and they understand and agree to come back to the Emergency Department if their symptoms are persistent or worsen. They express understanding, and patient with discharged in stable condition.    Amount and/or Complexity of Data Reviewed Radiology: ordered.  Risk OTC drugs. Prescription drug management.      Final Clinical Impression(s) / ED Diagnoses Final diagnoses:  Cough, unspecified type  Viral URI  Rx / DC Orders ED Discharge Orders          Ordered    azithromycin (ZITHROMAX) 250 MG tablet  Daily        11/21/22 1003    fluticasone (FLONASE) 50 MCG/ACT nasal spray  Daily        11/21/22 1003    benzonatate (TESSALON) 100 MG capsule  Every 8 hours        11/21/22 1003              Mardene Sayer, MD 11/21/22 1005

## 2022-11-21 NOTE — Discharge Instructions (Signed)
You have been seen in the Emergency Department (ED)  today for cough and congestion.  Your workup today is most consistent with an upper respiratory infection that is likely viral in nature.  Please drink plenty of fluids.  You may use Tylenol or Motrin, as written on the box, as needed for fever or discomfort.  Your x-ray showed no pneumonia.  Your COVID RSV and flu test were negative.  You can use the fluticasone spray for congestion and nasal drainage, the Tessalon Perles for cough, and the Z-Pack if you continue to have cough and fevers for coverage of atypical bacterial infections.   Return to the Emergency Department (ED)  if you have worsening trouble breathing, chest pain, difficulty swallowing, neck pain or stiffness or other symptoms that concern you.  Please make an appointment to follow up with your primary care doctor within one week to assure improvement or resolution in symptoms.

## 2022-11-21 NOTE — ED Triage Notes (Signed)
"  Chest congestion, lethargic since Monday, instead of getting better it seems to get worse and developed fever yesterday. Took motrin around an hour ago" per pt

## 2022-12-07 ENCOUNTER — Other Ambulatory Visit: Payer: Self-pay | Admitting: Psychiatry

## 2022-12-07 DIAGNOSIS — F411 Generalized anxiety disorder: Secondary | ICD-10-CM

## 2022-12-07 DIAGNOSIS — F3341 Major depressive disorder, recurrent, in partial remission: Secondary | ICD-10-CM

## 2022-12-08 ENCOUNTER — Other Ambulatory Visit: Payer: Self-pay | Admitting: Psychiatry

## 2022-12-08 DIAGNOSIS — F3341 Major depressive disorder, recurrent, in partial remission: Secondary | ICD-10-CM

## 2022-12-08 DIAGNOSIS — F411 Generalized anxiety disorder: Secondary | ICD-10-CM

## 2022-12-31 ENCOUNTER — Other Ambulatory Visit: Payer: Self-pay | Admitting: Psychiatry

## 2022-12-31 DIAGNOSIS — F3341 Major depressive disorder, recurrent, in partial remission: Secondary | ICD-10-CM

## 2022-12-31 DIAGNOSIS — F411 Generalized anxiety disorder: Secondary | ICD-10-CM

## 2023-01-04 ENCOUNTER — Other Ambulatory Visit: Payer: Self-pay | Admitting: Psychiatry

## 2023-01-04 DIAGNOSIS — F411 Generalized anxiety disorder: Secondary | ICD-10-CM

## 2023-01-04 DIAGNOSIS — F3341 Major depressive disorder, recurrent, in partial remission: Secondary | ICD-10-CM

## 2023-01-11 ENCOUNTER — Encounter: Payer: Self-pay | Admitting: Psychiatry

## 2023-01-11 ENCOUNTER — Ambulatory Visit: Payer: Self-pay | Admitting: Psychiatry

## 2023-01-11 DIAGNOSIS — F3341 Major depressive disorder, recurrent, in partial remission: Secondary | ICD-10-CM

## 2023-01-11 DIAGNOSIS — F411 Generalized anxiety disorder: Secondary | ICD-10-CM | POA: Diagnosis not present

## 2023-01-11 MED ORDER — VENLAFAXINE HCL ER 75 MG PO CP24: 225.0000 mg | ORAL_CAPSULE | Freq: Every day | ORAL | 3 refills | Status: DC

## 2023-01-11 NOTE — Addendum Note (Signed)
Addended by: Kirstie Peri on: 01/11/2023 03:08 PM   Modules accepted: Orders, Level of Service

## 2023-01-11 NOTE — Progress Notes (Addendum)
Krystal Price [id number] 1956-01-18 67 y.o.  Subjective:   Patient ID:  Krystal Price is a 68 y.o. (DOB 1956/02/25) female.  Chief Complaint:  Chief Complaint  Patient presents with   Follow-up   Anxiety   Depression    Depression        Associated symptoms include no decreased concentration, no fatigue and no suicidal ideas.  Krystal Price presents to the office today for follow-up of depression and anxiety  seen August 2020.  No meds were changed.  She was doing well off of Abilify.  October 12, 2019 appointment the following is noted: Still tolerating and benefitting from venlafaxine XR 225 mg daily. Depression generally under control.  Occ brif spells of it.  Anxiety is not too bad considering stressors.   H health is good.  Sleep is unchanged with occ problems recurring.  Enough sleep with sleeper. Life is stressful and some anxiety but manageable.  Not many days of depression.  Busy helps her.  Some anxiety and anger over what's going on.  Son lives with her with 4 kids 4 days a week.  Other parent is a source of problem and stress.  Stress home schooling.  Son [name] sleeps in AM and works nights creating stress.  Satisfied with meds. Pllan no med changes  4//20/2022 appt noted: Overall Ok with ongoing family stressors. Vitamin D level 30 and felt better with level up to 80 with better energy. Health good.   Krystal Price also healthy. No problems with meds. Patient reports stable mood and denies depressed or irritable moods.  Patient denies any recent difficulty with anxiety.  Patient denies difficulty with sleep initiation or maintenance about 2/3 of the time.  [name] snores and will wake her. Denies appetite disturbance.  Patient reports that energy and motivation have been good.  Patient denies any difficulty with concentration.  Patient denies any suicidal ideation. Started relief factor (tumeric, OFA, Icarin,  Reservitrol) helped pain a lot.  Will interfere with sleep if  takes it late Health is better. Plan: No med changes. Continue venlafaxine XR 225 mg daily.  10/02/21 appt noted: Same as last year.  Overall depression and anxiety under control with break through periods.    Patient denies difficulty with sleep initiation or maintenance. Denies appetite disturbance.  Patient reports that energy and motivation have been good.  Patient denies any difficulty with concentration.  Patient denies any suicidal ideation. Sleep better.  . Still on venlafaxie XR 225 mg daily. Better sleep with progesterone.    09/2022 NS  01/11/23 appt noted: Going to [name]. Bout 2 week dep more than usual and otherwise not more than a day or 2  in the last year.  It was bc she was alone and thinking negatively.  Feels she should be doing something but not sure what.  Involved politically and doing that.  Enjoys that.    Psych med Venlafaxine XR 225 mg daily .  Consistent and no SE. Anxiety about the same.  At times triggered with certain family.   Sleep good.  Past psych meds: Zoloft, brief Abilify 2.5 NR, Effexor 225  Review of Systems:  Review of Systems  Constitutional:  Negative for fatigue.  Cardiovascular:  Negative for chest pain and palpitations.  Musculoskeletal:        Sciatica better  Neurological:  Negative for tremors.  Psychiatric/Behavioral:  Positive for depression. Negative for agitation, behavioral problems, confusion, decreased concentration, dysphoric mood, hallucinations, self-injury,  sleep disturbance and suicidal ideas. The patient is nervous/anxious. The patient is not hyperactive.     Medications: I have reviewed the patient's current medications.  Current Outpatient Medications  Medication Sig Dispense Refill   acetaminophen (TYLENOL) 500 MG tablet Take 500 mg by mouth every 6 (six) hours as needed for moderate pain.     azithromycin (ZITHROMAX) 250 MG tablet Take 1 tablet (250 mg total) by mouth daily. Take first 2  tablets together, then 1 every day until finished. 6 tablet 0   benzonatate (TESSALON) 100 MG capsule Take 1 capsule (100 mg total) by mouth every 8 (eight) hours. 21 capsule 0   EPINEPHrine 0.3 mg/0.3 mL IJ SOAJ injection Inject 0.3 mg into the muscle as needed for anaphylaxis. 1 each 0   estradiol (CLIMARA - DOSED IN MG/24 HR) 0.1 mg/24hr patch Place 0.25 mg onto the skin once a week.     fluticasone (FLONASE) 50 MCG/ACT nasal spray Place 2 sprays into both nostrils daily. 16 g 0   ibuprofen (ADVIL,MOTRIN) 200 MG tablet Take 400 mg by mouth every 6 (six) hours as needed for moderate pain.     Multiple Vitamin (MULTIVITAMIN WITH MINERALS) TABS tablet Take 2 tablets by mouth daily.     Progesterone 100 MG SUPP Take by mouth.     famotidine (PEPCID) 20 MG tablet Take 1 tablet (20 mg total) by mouth 2 (two) times daily. 10 tablet 0   venlafaxine XR (EFFEXOR-XR) 75 MG 24 hr capsule Take 3 capsules (225 mg total) by mouth daily. 270 capsule 3   No current facility-administered medications for this visit.    Medication Side Effects: None  Allergies:  Allergies  Allergen Reactions   Penicillins Rash    Abdominal rash Has patient had a PCN reaction causing immediate rash, facial/tongue/throat swelling, SOB or lightheadedness with hypotension: Yes Has patient had a PCN reaction causing severe rash involving mucus membranes or skin necrosis: No Has patient had a PCN reaction that required hospitalization No Has patient had a PCN reaction occurring within the last 10 years: No If all of the above answers are "NO", then may proceed with Cephalosporin use.     Past Medical History:  Diagnosis Date   CML (chronic myelocytic leukemia) (HCC)    CML (chronic myelocytic leukemia) (HCC)    Depression     History reviewed. No pertinent family history.  Social History   Socioeconomic History   Marital status: Married    Spouse name: Not on file   Number of children: Not on file   Years of  education: Not on file   Highest education level: Not on file  Occupational History   Not on file  Tobacco Use   Smoking status: Never   Smokeless tobacco: Never  Substance and Sexual Activity   Alcohol use: No   Drug use: No   Sexual activity: Not on file  Other Topics Concern   Not on file  Social History Narrative   Not on file   Social Determinants of Health   Financial Resource Strain: Not on file  Food Insecurity: Not on file  Transportation Needs: Not on file  Physical Activity: Not on file  Stress: No Stress Concern Present (10/06/2021)   Received from [facility] - Occupational Stress Questionnaire    Feeling of Stress : Not at all  Social Connections: Unknown (10/15/2021)   Received from [facility]   Social Network  Social Network: Not on file  Intimate Partner Violence: Unknown (09/18/2021)   Received from [facility]   HITS    Physically Hurt: Not on file    Insult or Talk Down To: Not on file    Threaten Physical Harm: Not on file    Scream or Curse: Not on file    Past Medical History, Surgical history, Social history, and Family history were reviewed and updated as appropriate.   Please see review of systems for further details on the patient's review from today.   Objective:   Physical Exam:  There were no vitals taken for this visit.  Physical Exam Constitutional:      General: She is not in acute distress.    Appearance: She is well-developed.  Musculoskeletal:        General: No deformity.  Neurological:     Mental Status: She is alert and oriented to person, place, and time.     Motor: No tremor.     Coordination: Coordination normal.     Gait: Gait normal.  Psychiatric:        Attention and Perception: Attention and perception normal. She is attentive.        Mood and Affect: Mood is anxious. Mood is not depressed. Affect is not labile, blunt, angry or inappropriate.        Speech: Speech  normal.        Behavior: Behavior normal. Behavior is not slowed.        Thought Content: Thought content normal. Thought content is not delusional. Thought content does not include homicidal or suicidal ideation. Thought content does not include suicidal plan.        Cognition and Memory: Cognition normal.        Judgment: Judgment normal.     Comments: Insight is good.     Lab Review:     Component Value Date/Time   NA 134 (L) 01/18/2016 1234   K 3.7 01/18/2016 1234   CL 101 01/18/2016 1234   CO2 25 01/18/2016 1234   GLUCOSE 105 (H) 01/18/2016 1234   BUN 14 01/18/2016 1234   CREATININE 0.65 01/18/2016 1234   CALCIUM 8.4 (L) 01/18/2016 1234   PROT 7.3 01/18/2016 1234   ALBUMIN 3.9 01/18/2016 1234   AST 88 (H) 01/18/2016 1234   ALT 107 (H) 01/18/2016 1234   ALKPHOS 90 01/18/2016 1234   BILITOT 0.7 01/18/2016 1234   GFRNONAA >60 01/18/2016 1234   GFRAA >60 01/18/2016 1234       Component Value Date/Time   WBC 8.5 01/18/2016 1457   RBC 3.34 (L) 01/18/2016 1457   HGB 10.3 (L) 01/18/2016 1457   HCT 30.7 (L) 01/18/2016 1457   PLT 182 01/18/2016 1457   MCV 91.9 01/18/2016 1457   MCH 30.8 01/18/2016 1457   MCHC 33.6 01/18/2016 1457   RDW 13.8 01/18/2016 1457   LYMPHSABS 1.2 01/18/2016 1457   MONOABS 0.9 01/18/2016 1457   EOSABS 0.0 01/18/2016 1457   BASOSABS 0.0 01/18/2016 1457    No results found for: "POCLITH", "LITHIUM"   No results found for: "PHENYTOIN", "PHENOBARB", "VALPROATE", "CBMZ"   .res Assessment: Plan:    Generalized anxiety disorder - Plan: venlafaxine XR (EFFEXOR-XR) 75 MG 24 hr capsule  Recurrent major depression in partial remission (HCC) - Plan: venlafaxine XR (EFFEXOR-XR) 75 MG 24 hr capsule  Overall mood stable and she's satisfied. Enjoying the family.  Doing well without the Abilify.  Consider retrial later if needed at a  higher dosage like 5 mg.  No med changes. Continue venlafaxine XR 225 mg daily.  Supportive therapy on stressors.   Encourage keep regular activity.    Estrogen patch and thyroid supp helped mood too.  FU 1 year or sooner prn  Meredith Staggers, MD, DFAPA  Please see After Visit Summary for patient specific instructions.  No future appointments.  No orders of the defined types were placed in this encounter.     -------------------------------

## 2023-06-29 ENCOUNTER — Telehealth: Payer: Self-pay | Admitting: Psychiatry

## 2023-06-29 NOTE — Telephone Encounter (Signed)
 Pt called and said that she would like to wean down off of the effexor xr 75 mg. Please call hger at [phone] and let her know if that is possible and directions .

## 2023-06-30 NOTE — Telephone Encounter (Signed)
 I last saw her in July.  She was taking Effexor  XR 75 mg capsules 3 daily.  Please document whether or not she is still taking 3 daily.  Please ask her why it is that she wants to wean off the medications so that can be documented.  She is aware that there is a risk of increased depression and anxiety with weaning off the medicine.  If she wants to proceed she can reduce from 3 capsules daily to 2 capsules daily for 6 weeks and then 1 capsule daily.  Do not stop until she sees me.  We need to move up the appointment to follow up in April and then we will finish the taper.  Call to schedule her in April

## 2023-07-01 NOTE — Telephone Encounter (Signed)
She is still taking Effexor XR 75 MG 3 times daily. She wants to wean off because she is "just ready to try it". I informed her of the increased risks and she would still liek to proceed. I gave your instructions of how to taper down. She understood.

## 2023-07-01 NOTE — Telephone Encounter (Signed)
Noted. Agreed.

## 2023-07-06 NOTE — Telephone Encounter (Signed)
Called pt and she has appt 4/21

## 2023-09-15 ENCOUNTER — Other Ambulatory Visit: Payer: Self-pay | Admitting: Psychiatry

## 2023-09-15 ENCOUNTER — Other Ambulatory Visit: Payer: Self-pay

## 2023-09-15 ENCOUNTER — Telehealth: Payer: Self-pay | Admitting: Psychiatry

## 2023-09-15 DIAGNOSIS — F411 Generalized anxiety disorder: Secondary | ICD-10-CM

## 2023-09-15 DIAGNOSIS — F3341 Major depressive disorder, recurrent, in partial remission: Secondary | ICD-10-CM

## 2023-09-15 MED ORDER — VENLAFAXINE HCL ER 75 MG PO CP24: 75.0000 mg | ORAL_CAPSULE | Freq: Every day | ORAL | 0 refills | Status: DC

## 2023-09-15 NOTE — Telephone Encounter (Signed)
 Per January phone call:  If she wants to proceed she can reduce from 3 capsules daily to 2 capsules daily for 6 weeks and then 1 capsule daily. Do not stop until she sees me.   LVM to RC.

## 2023-09-15 NOTE — Telephone Encounter (Signed)
 Patient reporting she did not remember to stay on the 1 tablet of Effexor and she stopped it one week ago. She said she is very emotional and jittery. She is out of town taking care of her mother, has FU 4/21. She is asking for a small dose of Effexor.  Please advise.

## 2023-09-15 NOTE — Telephone Encounter (Signed)
 Pt called 8:45 has been weaning off Effexor. Now out of state with mother. Having a lot of side effects jitters, emotional. Would like small dose to be able to get home. RTC [phone]

## 2023-09-15 NOTE — Telephone Encounter (Signed)
 RF for venlafaxine 75 mg XR sent, #30, to pharmacy in [place]. She was notified not to stop medication until she is seen for FU.

## 2023-09-15 NOTE — Telephone Encounter (Signed)
 Disc with CMA.  Pt having WD bc stopped venlafaxine too abruptly.  Resume XR 75  until appt in April.

## 2023-10-04 ENCOUNTER — Ambulatory Visit: Payer: BC Managed Care – PPO | Admitting: Psychiatry

## 2023-10-04 ENCOUNTER — Encounter: Payer: Self-pay | Admitting: Psychiatry

## 2023-10-04 DIAGNOSIS — F3341 Major depressive disorder, recurrent, in partial remission: Secondary | ICD-10-CM | POA: Diagnosis not present

## 2023-10-04 DIAGNOSIS — F411 Generalized anxiety disorder: Secondary | ICD-10-CM

## 2023-10-04 MED ORDER — VENLAFAXINE HCL ER 37.5 MG PO CP24: 37.5000 mg | ORAL_CAPSULE | Freq: Every day | ORAL | 1 refills | Status: DC

## 2023-10-04 NOTE — Progress Notes (Signed)
 Krystal Price [id number] Jan 27, 1956 68 y.o.  Virtual Visit via Telephone Note  I connected with pt by telephone and verified that I am speaking with the correct person using two identifiers.   I discussed the limitations, risks, security and privacy concerns of performing an evaluation and management service by telephone and the availability of in person appointments. I also discussed with the patient that there may be a patient responsible charge related to this service. The patient expressed understanding and agreed to proceed.  I discussed the assessment and treatment plan with the patient. The patient was provided an opportunity to ask questions and all were answered. The patient agreed with the plan and demonstrated an understanding of the instructions.   The patient was advised to call back or seek an in-person evaluation if the symptoms worsen or if the condition fails to improve as anticipated.  I provided 20 minutes of non-face-to-face time during this encounter. The call started at 200 and ended at 220. The patient was located at home and the provider was located office.   Subjective:   Patient ID:  Krystal Price is a 68 y.o. (DOB 05/20/1956) female.  Chief Complaint:  Chief Complaint  Patient presents with   Follow-up   Depression   Anxiety   Medication Reaction    [name] presents to the office today for follow-up of depression and anxiety  seen August 2020.  No meds were changed.  She was doing well off of Abilify.  October 12, 2019 appointment the following is noted: Still tolerating and benefitting from venlafaxine  XR 225 mg daily. Depression generally under control.  Occ brif spells of it.  Anxiety is not too bad considering stressors.   H health is good.  Sleep is unchanged with occ problems recurring.  Enough sleep with sleeper. Life is stressful and some anxiety but manageable.  Not many days of depression.  Busy helps her.  Some anxiety and anger over  what's going on.  Son lives with her with 4 kids 4 days a week.  Other parent is a source of problem and stress.  Stress home schooling.  Son Krystal Price sleeps in AM and works nights creating stress.  Satisfied with meds. Pllan no med changes  4//20/2022 appt noted: Overall Ok with ongoing family stressors. Vitamin D level 30 and felt better with level up to 80 with better energy. Health good.   Krystal Price also healthy. No problems with meds. Patient reports stable mood and denies depressed or irritable moods.  Patient denies any recent difficulty with anxiety.  Patient denies difficulty with sleep initiation or maintenance about 2/3 of the time.  [name] snores and will wake her. Denies appetite disturbance.  Patient reports that energy and motivation have been good.  Patient denies any difficulty with concentration.  Patient denies any suicidal ideation. Started relief factor (tumeric, OFA, Icarin,  Reservitrol) helped pain a lot.  Will interfere with sleep if takes it late Health is better. Plan: No med changes. Continue venlafaxine  XR 225 mg daily.  10/02/21 appt noted: Same as last year.  Overall depression and anxiety under control with break through periods.    Patient denies difficulty with sleep initiation or maintenance. Denies appetite disturbance.  Patient reports that energy and motivation have been good.  Patient denies any difficulty with concentration.  Patient denies any suicidal ideation. Sleep better.  . Still on venlafaxie XR 225 mg daily. Better sleep with progesterone.    09/2022 NS  01/11/23  appt noted: Going to [name]. Bout 2 week dep more than usual and otherwise not more than a day or 2  in the last year.  It was bc she was alone and thinking negatively.  Feels she should be doing something but not sure what.  Involved politically and doing that.  Enjoys that.    Psych med Venlafaxine  XR 225 mg daily .  Consistent and no SE. Anxiety about the same.   At times triggered with certain family.   Sleep good.  06/30/23 GC:  Pt called and said that she would like to wean down off of the effexor  xr 75 mg. She is still taking Effexor  XR 75 MG 3 times daily. She wants to wean off because she is "just ready to try it".    MD re:  I last saw her in July.  She was taking Effexor  XR 75 mg capsules 3 daily.  Please document whether or not she is still taking 3 daily.  Please ask her why it is that she wants to wean off the medications so that can be documented.  She is aware that there is a risk of increased depression and anxiety with weaning off the medicine.  If she wants to proceed she can reduce from 3 capsules daily to 2 capsules daily for 6 weeks and then 1 capsule daily.  Do not stop until she sees me.  We need to move up the appointment to follow up in April and then we will finish the taper.   Call to schedule her in April    09/15/23 TC Pt called 8:45 has been weaning off Effexor . Now [place] of [place] with mother. Having a lot of side effects jitters, emotional. Would like small dose to be able to get home. RTC [phone]      Patient reporting she did not remember to stay on the 1 tablet of Effexor  and she stopped it one week ago. She said she is very emotional and jittery. She is out of town taking care of her mother, has FU 4/21. She is asking for a small dose of Effexor .  Please advise.     MD:  Disc with CMA.  Pt having WD bc stopped venlafaxine  too abruptly.  Resume XR 75  until appt in April.           10/04/23 appt noted:  Med: doing fine now on Effexor  XR 75mg  daily. No problem with reduction in dose until stopped the 75 mg dose after a month. Nopx with dep and anxiety and irritability. Still wants to taper off.  Had SSRI WD. Disc SS relapse and call if occurs.  She's aware of risks.  Past psych meds: Zoloft, brief Abilify 2.5 NR, Effexor  225  Review of Systems:  Review of Systems  Constitutional:  Negative for fatigue.   Cardiovascular:  Negative for chest pain and palpitations.  Musculoskeletal:        Sciatica better  Neurological:  Negative for tremors.  Psychiatric/Behavioral:  Positive for depression. Negative for agitation, behavioral problems, confusion, decreased concentration, dysphoric mood, hallucinations, self-injury, sleep disturbance and suicidal ideas. The patient is not nervous/anxious and is not hyperactive.     Medications: I have reviewed the patient's current medications.  Current Outpatient Medications  Medication Sig Dispense Refill   estradiol (CLIMARA - DOSED IN MG/24 HR) 0.1 mg/24hr patch Place 0.25 mg onto the skin once a week.     Multiple Vitamin (MULTIVITAMIN WITH MINERALS) TABS tablet  Take 2 tablets by mouth daily.     Progesterone 100 MG SUPP Take by mouth.     acetaminophen  (TYLENOL ) 500 MG tablet Take 500 mg by mouth every 6 (six) hours as needed for moderate pain.     EPINEPHrine  0.3 mg/0.3 mL IJ SOAJ injection Inject 0.3 mg into the muscle as needed for anaphylaxis. 1 each 0   venlafaxine  XR (EFFEXOR -XR) 37.5 MG 24 hr capsule Take 1 capsule (37.5 mg total) by mouth daily. 30 capsule 1   No current facility-administered medications for this visit.    Medication Side Effects: None  Allergies:  Allergies  Allergen Reactions   Penicillins Rash    Abdominal rash Has patient had a PCN reaction causing immediate rash, facial/tongue/throat swelling, SOB or lightheadedness with hypotension: Yes Has patient had a PCN reaction causing severe rash involving mucus membranes or skin necrosis: No Has patient had a PCN reaction that required hospitalization No Has patient had a PCN reaction occurring within the last 10 years: No If all of the above answers are "NO", then may proceed with Cephalosporin use.     Past Medical History:  Diagnosis Date   CML (chronic myelocytic leukemia) (HCC)    CML (chronic myelocytic leukemia) (HCC)    Depression     History reviewed. No  pertinent family history.  Social History   Socioeconomic History   Marital status: Married    Spouse name: Not on file   Number of children: Not on file   Years of education: Not on file   Highest education level: Not on file  Occupational History   Not on file  Tobacco Use   Smoking status: Never   Smokeless tobacco: Never  Substance and Sexual Activity   Alcohol use: No   Drug use: No   Sexual activity: Not on file  Other Topics Concern   Not on file  Social History Narrative   Not on file   Social Drivers of Health   Financial Resource Strain: Not on file  Food Insecurity: Not on file  Transportation Needs: Not on file  Physical Activity: Not on file  Stress: No Stress Concern Present (10/06/2021)   Received from [facility], [facility]   [employer] [facility] - Occupational Stress Questionnaire    Feeling of Stress : Not at all  Social Connections: Unknown (10/15/2021)   [facility] from [facility], [facility]   Social Network    Social Network: Not on file  Intimate Partner Violence: Unknown (09/18/2021)   Received from [facility], [facility]   HITS    Physically Hurt: Not on file    Insult or Talk Down To: Not on file    Threaten Physical Harm: Not on file    Scream or Curse: Not on file    Past Medical History, Surgical history, Social history, and Family history were reviewed and updated as appropriate.   Please see review of systems for further details on the patient's review from today.   Objective:   Physical Exam:  There were no vitals taken for this visit.  Physical Exam Neurological:     Mental Status: She is alert and oriented to person, place, and time.     Cranial Nerves: No dysarthria.  Psychiatric:        Attention and Perception: Attention and perception normal.        Mood and Affect: Mood normal. Mood is not anxious or depressed.  Speech: Speech normal.        Behavior: Behavior is cooperative.         Thought Content: Thought content normal. Thought content is not paranoid or delusional. Thought content does not include homicidal or suicidal ideation. Thought content does not include suicidal plan.        Cognition and Memory: Cognition and memory normal.        Judgment: Judgment normal.     Comments: Insight intact     Lab Review:     Component Value Date/Time   NA 134 (L) 01/18/2016 1234   K 3.7 01/18/2016 1234   CL 101 01/18/2016 1234   CO2 25 01/18/2016 1234   GLUCOSE 105 (H) 01/18/2016 1234   BUN 14 01/18/2016 1234   CREATININE 0.65 01/18/2016 1234   CALCIUM 8.4 (L) 01/18/2016 1234   PROT 7.3 01/18/2016 1234   ALBUMIN 3.9 01/18/2016 1234   AST 88 (H) 01/18/2016 1234   ALT 107 (H) 01/18/2016 1234   ALKPHOS 90 01/18/2016 1234   BILITOT 0.7 01/18/2016 1234   GFRNONAA >60 01/18/2016 1234   GFRAA >60 01/18/2016 1234       Component Value Date/Time   WBC 8.5 01/18/2016 1457   RBC 3.34 (L) 01/18/2016 1457   HGB 10.3 (L) 01/18/2016 1457   HCT 30.7 (L) 01/18/2016 1457   PLT 182 01/18/2016 1457   MCV 91.9 01/18/2016 1457   MCH 30.8 01/18/2016 1457   MCHC 33.6 01/18/2016 1457   RDW 13.8 01/18/2016 1457   LYMPHSABS 1.2 01/18/2016 1457   MONOABS 0.9 01/18/2016 1457   EOSABS 0.0 01/18/2016 1457   BASOSABS 0.0 01/18/2016 1457    No results found for: "POCLITH", "LITHIUM"   No results found for: "PHENYTOIN", "PHENOBARB", "VALPROATE", "CBMZ"   .res Assessment: Plan:    Generalized anxiety disorder - Plan: venlafaxine  XR (EFFEXOR -XR) 37.5 MG 24 hr capsule  Recurrent major depression in partial remission (HCC) - Plan: venlafaxine  XR (EFFEXOR -XR) 37.5 MG 24 hr capsule  Overall mood stable and she's satisfied. Enjoying the family.  No px with dep and anxiety She still feels ready and wants to stop.  Disc multiple ways to taper but will try easiest firs.  She will reduce venlafaxine  XR up to 37.5 mg daily to take that a month and then stop it.  We discussed the  signs and symptoms of SSRI withdrawal and she has been again we will consider easy either using the venlafaxine  tablets or perhaps using fluoxetine to taper if needed.  We discussed the various ways to do this. We also discussed the risk of relapse of depression or anxiety coming off this medication and she will contact us  if that occurs.  She is aware of what close signs and symptoms are but would like to still taper the medicine  Supportive therapy on stressors.  Encourage keep regular activity.    Estrogen patch and thyroid supp helped mood too.  FU sooner prn  Nori Beat, MD, DFAPA  Please see After Visit Summary for patient specific instructions.  Future Appointments  Date Time Provider Department Center  01/12/2024  1:00 PM Cottle, Kennedy Peabody., MD [facility] None    No orders of the defined types were placed in this encounter.     -------------------------------

## 2023-10-27 ENCOUNTER — Other Ambulatory Visit: Payer: Self-pay | Admitting: Psychiatry

## 2023-10-27 DIAGNOSIS — F411 Generalized anxiety disorder: Secondary | ICD-10-CM

## 2023-10-27 DIAGNOSIS — F3341 Major depressive disorder, recurrent, in partial remission: Secondary | ICD-10-CM

## 2024-01-12 ENCOUNTER — Ambulatory Visit: Payer: BC Managed Care – PPO | Admitting: Psychiatry

## 2024-01-25 ENCOUNTER — Telehealth: Payer: Self-pay | Admitting: Psychiatry

## 2024-01-25 MED ORDER — VENLAFAXINE HCL ER 75 MG PO CP24: 75.0000 mg | ORAL_CAPSULE | Freq: Every day | ORAL | 2 refills | Status: DC

## 2024-01-25 NOTE — Telephone Encounter (Signed)
 Pt had stopped Venlafaxine  37.5 mg, restarted it about a month ago due to increased anxiety. Still feels like she needs to up the dose and asking to increase to 75 mg.   Reporting more anxiety 6/10, dep 4/10. Reports when it is just she and her husband she does well, but when she is out in public and things don't go well, or her grandchildren don't listen her anxiety increases. Currently feeling like a failure due to issues with DIL.

## 2024-01-25 NOTE — Telephone Encounter (Signed)
 Resume venalafaxine ER 37.5 mg daily for 1 week then increae to 75 mg daily.  #60.  Then send in 75 mg with next fill when needed.

## 2024-01-25 NOTE — Telephone Encounter (Signed)
 Pt has been taking 37.5 mg for about a month. Sent in Rx for venlafaxine  XR 75 mg.

## 2024-01-25 NOTE — Telephone Encounter (Signed)
 Next appt 04/04/24. Krystal Price is requesting a refill on her Venlafaxine  75 mg. It was previously 37.5 mg. Pharmacy is:  [id number] - [place], [place] - [address] [facility] [address]    Phone: [phone]  Fax: [phone]

## 2024-02-16 ENCOUNTER — Other Ambulatory Visit: Payer: Self-pay | Admitting: Psychiatry

## 2024-03-15 ENCOUNTER — Ambulatory Visit (INDEPENDENT_AMBULATORY_CARE_PROVIDER_SITE_OTHER): Admitting: Psychiatry

## 2024-03-15 ENCOUNTER — Encounter: Payer: Self-pay | Admitting: Psychiatry

## 2024-03-15 DIAGNOSIS — F3341 Major depressive disorder, recurrent, in partial remission: Secondary | ICD-10-CM | POA: Diagnosis not present

## 2024-03-15 DIAGNOSIS — F411 Generalized anxiety disorder: Secondary | ICD-10-CM | POA: Diagnosis not present

## 2024-03-15 MED ORDER — VENLAFAXINE HCL ER 75 MG PO CP24: 75.0000 mg | ORAL_CAPSULE | Freq: Every day | ORAL | 2 refills | Status: DC

## 2024-03-15 NOTE — Progress Notes (Signed)
 Krystal Price [id number] [birth year] 68 y.o.   Subjective:   Patient ID:  Krystal Price is a 68 y.o. (DOB [birth year]) female.  Chief Complaint:  Chief Complaint  Patient presents with   Follow-up   Depression   Anxiety   Stress    Krystal Price presents to the office today for follow-up of depression and anxiety  seen August 2020.  No meds were changed.  She was doing well off of Abilify.  October 12, 2019 appointment the following is noted: Still tolerating and benefitting from venlafaxine  XR 225 mg daily. Depression generally under control.  Occ brif spells of it.  Anxiety is not too bad considering stressors.   H health is good.  Sleep is unchanged with occ problems recurring.  Enough sleep with sleeper. Life is stressful and some anxiety but manageable.  Not many days of depression.  Busy helps her.  Some anxiety and anger over what's going on.  Son lives with her with 4 kids 4 days a week.  Other parent is a source of problem and stress.  Stress home schooling.  Son Krystal Price sleeps in AM and works nights creating stress.  Satisfied with meds. Pllan no med changes  4//20/2022 appt noted: Overall Ok with ongoing family stressors. Vitamin D level 30 and felt better with level up to 80 with better energy. Health good.   VEAR Krystal Price also healthy. No problems with meds. Patient reports stable mood and denies depressed or irritable moods.  Patient denies any recent difficulty with anxiety.  Patient denies difficulty with sleep initiation or maintenance about 2/3 of the time.  Krystal Price snores and will wake her. Denies appetite disturbance.  Patient reports that energy and motivation have been good.  Patient denies any difficulty with concentration.  Patient denies any suicidal ideation. Started relief factor (tumeric, OFA, Icarin,  Reservitrol) helped pain a lot.  Will interfere with sleep if takes it late Health is better. Plan: No med changes. Continue venlafaxine  XR 225 mg  daily.  10/02/21 appt noted: Same as last year.  Overall depression and anxiety under control with break through periods.    Patient denies difficulty with sleep initiation or maintenance. Denies appetite disturbance.  Patient reports that energy and motivation have been good.  Patient denies any difficulty with concentration.  Patient denies any suicidal ideation. Sleep better.  . Still on venlafaxie XR 225 mg daily. Better sleep with progesterone.    09/2022 NS  01/11/23 appt noted: Going to [facility]. Bout 2 week dep more than usual and otherwise not more than a day or 2  in the last year.  It was bc she was alone and thinking negatively.  Feels she should be doing something but not sure what.  Involved politically and doing that.  Enjoys that.    Psych med Venlafaxine  XR 225 mg daily .  Consistent and no SE. Anxiety about the same.  At times triggered with certain family.   Sleep good.  06/30/23 GC:  Pt called and said that she would like to wean down off of the effexor  xr 75 mg. She is still taking Effexor  XR 75 MG 3 times daily. She wants to wean off because she is just ready to try it.    MD re:  I last saw her in July.  She was taking Effexor  XR 75 mg capsules 3 daily.  Please document whether or not she is still taking 3 daily.  Please ask her why  it is that she wants to wean off the medications so that can be documented.  She is aware that there is a risk of increased depression and anxiety with weaning off the medicine.  If she wants to proceed she can reduce from 3 capsules daily to 2 capsules daily for 6 weeks and then 1 capsule daily.  Do not stop until she sees me.  We need to move up the appointment to follow up in April and then we will finish the taper.   Call to schedule her in April    09/15/23 TC Pt called 8:45 has been weaning off Effexor . Now out of [place] with mother. Having a lot of side effects jitters, emotional. Would like small dose to be  able to get home. RTC [phone]      Patient reporting she did not remember to stay on the 1 tablet of Effexor  and she stopped it one week ago. She said she is very emotional and jittery. She is out of town taking care of her mother, has FU 4/21. She is asking for a small dose of Effexor .  Please advise.     MD:  Disc with CMA.  Pt having WD bc stopped venlafaxine  too abruptly.  Resume XR 75  until appt in April.         10/04/23 appt noted:  Med: doing fine now on Effexor  XR 75mg  daily. No problem with reduction in dose until stopped the 75 mg dose after a month. Nopx with dep and anxiety and irritability. Still wants to taper off.  Had SSRI WD. Disc SS relapse and call if occurs.  She's aware of risks. Plan: She still feels ready and wants to stop.  Disc multiple ways to taper but will try easiest firs.  She will reduce venlafaxine  XR up to 37.5 mg daily to take that a month and then stop it.   01/25/24 TC:  Pt had stopped Venlafaxine  37.5 mg, restarted it about a month ago due to increased anxiety. Still feels like she needs to up the dose and asking to increase to 75 mg.   Reporting more anxiety 6/10, dep 4/10. Reports when it is just she and her husband she does well, but when she is out in public and things don't go well, or her grandchildren don't listen her anxiety increases. Currently feeling like a failure due to issues with DIL.     MD resp:  Resume venalafaxine ER 37.5 mg daily for 1 week then increae to 75 mg daily.  #60.  Then send in 75 mg with next fill when needed. Krystal Macintosh, MD, DFAPA      03/15/24 appt noted:  Med: resumed Effexor  bc a lot of anxiety XR 75 mg daily. No SE.  Had wanted to wean off Effexor  bc feeling good and just wanted to try it.  With Gkids was impatient and getting upset with them.  Sister burnt out caring for M and now M with her living in basement.  Very anxious managing M bc she's difficult.   Venlafaxine  helped by now. K[facility]  integrative rec L-theanine and it helped.    Less anxious with current meds.   Eating better and cut down on carbs and stopped caffeine.  She feels like dose is adequate but still would like to try again to wean when stress is better.  Collin's ex is now transgender.  Gets angry at things he/she does with kids.    Past psych meds: Zoloft, brief  Abilify 2.5 NR, Effexor  225  Review of Systems:  Review of Systems  Constitutional:  Negative for fatigue.  Cardiovascular:  Negative for chest pain and palpitations.  Musculoskeletal:        Sciatica better  Neurological:  Negative for tremors.  Psychiatric/Behavioral:  Negative for agitation, behavioral problems, confusion, decreased concentration, dysphoric mood, hallucinations, self-injury, sleep disturbance and suicidal ideas. The patient is not nervous/anxious and is not hyperactive.     Medications: I have reviewed the patient's current medications.  Current Outpatient Medications  Medication Sig Dispense Refill   estradiol (CLIMARA - DOSED IN MG/24 HR) 0.1 mg/24hr patch Place 0.25 mg onto the skin once a week.     Progesterone 100 MG SUPP Take by mouth.     acetaminophen  (TYLENOL ) 500 MG tablet Take 500 mg by mouth every 6 (six) hours as needed for moderate pain.     EPINEPHrine  0.3 mg/0.3 mL IJ SOAJ injection Inject 0.3 mg into the muscle as needed for anaphylaxis. 1 each 0   Multiple Vitamin (MULTIVITAMIN WITH MINERALS) TABS tablet Take 2 tablets by mouth daily.     venlafaxine  XR (EFFEXOR -XR) 75 MG 24 hr capsule Take 1 capsule (75 mg total) by mouth daily with breakfast. 90 capsule 2   No current facility-administered medications for this visit.    Medication Side Effects: None  Allergies:  Allergies  Allergen Reactions   Penicillins Rash    Abdominal rash Has patient had a PCN reaction causing immediate rash, facial/tongue/throat swelling, SOB or lightheadedness with hypotension: Yes Has patient had a PCN reaction causing  severe rash involving mucus membranes or skin necrosis: No Has patient had a PCN reaction that required hospitalization No Has patient had a PCN reaction occurring within the last 10 years: No If all of the above answers are NO, then may proceed with Cephalosporin use.     Past Medical History:  Diagnosis Date   CML (chronic myelocytic leukemia) (HCC)    CML (chronic myelocytic leukemia) (HCC)    Depression     History reviewed. No pertinent family history.  Social History   Socioeconomic History   Marital status: Married    Spouse name: Not on file   Number of children: Not on file   Years of education: Not on file   Highest education level: Not on file  Occupational History   Not on file  Tobacco Use   Smoking status: Never   Smokeless tobacco: Never  Substance and Sexual Activity   Alcohol use: No   Drug use: No   Sexual activity: Not on file  Other Topics Concern   Not on file  Social History Narrative   Not on file   Social Drivers of Health   Financial Resource Strain: Not on file  Food Insecurity: Low Risk  (10/14/2023)   Received from [facility]   Hunger Vital Sign    Within the past 12 months, you worried that your food would run out before you got money to buy more: Never true    Within the past 12 months, the food you bought just didn't last and you didn't have money to get more. : Never true  Transportation Needs: No Transportation Needs (10/14/2023)   Received from [facility]    In the past 12 months, has lack of reliable transportation kept you from medical appointments, meetings, work or from getting things needed for daily living? : No  Physical Activity: Not on file  Stress: No  Stress Concern Present (10/06/2021)   Received from [facility] Occupational [facility] - Occupational Stress Questionnaire    Feeling of Stress : Not at all  Social Connections: Unknown (10/15/2021)   Received from [facility]    Social Network    Social Network: Not on file  Intimate Partner Violence: Unknown (09/18/2021)   Received from [facility]   HITS    Physically Hurt: Not on file    Insult or Talk Down To: Not on file    Threaten Physical Harm: Not on file    Scream or Curse: Not on file    Past Medical History, Surgical history, Social history, and Family history were reviewed and updated as appropriate.   Please see review of systems for further details on the patient's review from today.   Objective:   Physical Exam:  There were no vitals taken for this visit.  Physical Exam Constitutional:      General: She is not in acute distress.    Appearance: She is well-developed.  Musculoskeletal:        General: No deformity.  Neurological:     Mental Status: She is alert and oriented to person, place, and time.     Cranial Nerves: No dysarthria.     Coordination: Coordination normal.  Psychiatric:        Attention and Perception: Attention and perception normal.        Mood and Affect: Mood normal. Mood is not anxious or depressed. Affect is not labile, blunt, angry or inappropriate.        Speech: Speech normal.        Behavior: Behavior normal. Behavior is cooperative.        Thought Content: Thought content normal. Thought content is not paranoid or delusional. Thought content does not include homicidal or suicidal ideation. Thought content does not include homicidal or suicidal plan.        Cognition and Memory: Cognition and memory normal.        Judgment: Judgment normal.     Comments: Insight intact     Lab Review:     Component Value Date/Time   NA 134 (L) 01/18/2016 1234   K 3.7 01/18/2016 1234   CL 101 01/18/2016 1234   CO2 25 01/18/2016 1234   GLUCOSE 105 (H) 01/18/2016 1234   BUN 14 01/18/2016 1234   CREATININE 0.65 01/18/2016 1234   CALCIUM 8.4 (L) 01/18/2016 1234   PROT 7.3 01/18/2016 1234   ALBUMIN 3.9 01/18/2016 1234   AST 88 (H) 01/18/2016 1234   ALT 107 (H)  01/18/2016 1234   ALKPHOS 90 01/18/2016 1234   BILITOT 0.7 01/18/2016 1234   GFRNONAA >60 01/18/2016 1234   GFRAA >60 01/18/2016 1234       Component Value Date/Time   WBC 8.5 01/18/2016 1457   RBC 3.34 (L) 01/18/2016 1457   HGB 10.3 (L) 01/18/2016 1457   HCT 30.7 (L) 01/18/2016 1457   PLT 182 01/18/2016 1457   MCV 91.9 01/18/2016 1457   MCH 30.8 01/18/2016 1457   MCHC 33.6 01/18/2016 1457   RDW 13.8 01/18/2016 1457   LYMPHSABS 1.2 01/18/2016 1457   MONOABS 0.9 01/18/2016 1457   EOSABS 0.0 01/18/2016 1457   BASOSABS 0.0 01/18/2016 1457    No results found for: POCLITH, LITHIUM   No results found for: PHENYTOIN, PHENOBARB, VALPROATE, CBMZ   .res Assessment: Plan:    Recurrent major depression in partial remission - Plan: venlafaxine   XR (EFFEXOR -XR) 75 MG 24 hr capsule  Generalized anxiety disorder - Plan: venlafaxine  XR (EFFEXOR -XR) 75 MG 24 hr capsule  Overall mood stable and she's satisfied. Enjoying the family.  No px with dep and anxiety Continue Effexor  XR 75 mg daily.  Supportive therapy on stressors.  Encourage keep regular activity.    Estrogen patch and thyroid supp helped mood too.  FU 6-12 mos  Krystal Macintosh, MD, DFAPA  Please see After Visit Summary for patient specific instructions.  Future Appointments  Date Time Provider Department Center  04/04/2024  9:00 AM Cottle, Krystal KANDICE Raddle., MD [facility] None    No orders of the defined types were placed in this encounter.     -------------------------------

## 2024-04-04 ENCOUNTER — Ambulatory Visit: Admitting: Psychiatry

## 2024-04-12 ENCOUNTER — Telehealth: Payer: Self-pay | Admitting: Psychiatry

## 2024-04-12 NOTE — Telephone Encounter (Signed)
 Krystal Price  lvm that she would like dr. Geoffry to send in a script for 75 mg of effexor  in liquid form so that she decrease gradually rather than with pills. Please call her at [phone]

## 2024-04-13 NOTE — Telephone Encounter (Signed)
 Pt wants to taper off Effexor  75 mg. She said she tried previously and it was too hard to drop down to 37.5 and she had to increase back to 75 mg. She wants to try to taper again and is asking if you can send in Rx for liquid so she can taper slower. Pharmacy is [facility] and they can compound if needed.   From 4/21 visit: No px with dep and anxiety She still feels ready and wants to stop.  Disc multiple ways to taper but will try easiest firs.  She will reduce venlafaxine  XR up to 37.5 mg daily to take that a month and then stop it.  We discussed the signs and symptoms of SSRI withdrawal and she has been again we will consider easy either using the venlafaxine  tablets or perhaps using fluoxetine to taper if needed.

## 2024-04-21 ENCOUNTER — Other Ambulatory Visit: Payer: Self-pay | Admitting: Psychiatry

## 2024-04-21 ENCOUNTER — Other Ambulatory Visit: Payer: Self-pay

## 2024-04-21 ENCOUNTER — Telehealth: Payer: Self-pay | Admitting: Emergency Medicine

## 2024-04-21 MED ORDER — FLUOXETINE HCL 10 MG PO CAPS: 10.0000 mg | ORAL_CAPSULE | Freq: Every day | ORAL | 0 refills | Status: AC

## 2024-04-21 MED ORDER — VENLAFAXINE HCL 25 MG PO TABS: ORAL_TABLET | ORAL | 0 refills | Status: AC

## 2024-04-21 NOTE — Telephone Encounter (Signed)
 Spoke to pt at 2:00 pm regarding tapering of Effexor  as requested.  The patient expressed some hesitancy about adding a new medication and would prefer slower taper but understands the rationale and agreed to begin the taper tomorrow as discussed. She will monitor for any withdrawal symptoms or issues.   Venlafaxine  has a short half life and therefore is more likely to cause serotonin withdrawal sx. there is no venlafaxine  suspension made but there is another easier way of doing this which is to add in a long-acting serotonin medicine while we taper the venlafaxine  using the tablet version as follows.   Add fluoxetine 10 mg capsules, 1 daily. Wait 1 week and then reduce venlafaxine  using the 25 mg tablets 1 twice daily for 2 weeks, Then reduce venlafaxine  to 1 tablet in the morning and 1/2 tablet at night for 2 weeks, Then reduce venlafaxine  to half tablet twice daily for 2 weeks, Then reduce venlafaxine  to half tablet in the morning for 2 weeks. Then stop both fluoxetine and venlafaxine .  Prescriptions will be sent. Pt understands medication instructions will be added to Mychart as well.   Patient engaged in shared decision-making; tx plan reviewed and agreed upon.    Simcha Speir PA-C

## 2024-04-21 NOTE — Telephone Encounter (Signed)
 Sorry for the delay in my response.  I overlooked the message until today.  Venlafaxine  has a short half life and therefore is more likely to cause serotonin withdrawal sx. there is no venlafaxine  suspension made but there is another easier way of doing this which is to add in a long-acting serotonin medicine while we taper the venlafaxine  using the tablet version as follows.  Add fluoxetine 10 mg capsules, 1 daily. Wait 1 week and then reduce venlafaxine  using the 25 mg tablets 1 twice daily for 2 weeks, Then reduce venlafaxine  to 1 tablet in the morning and 1/2 tablet at night for 2 weeks, Then reduce venlafaxine  to half tablet twice daily for 2 weeks, Then reduce venlafaxine  to half tablet in the morning for 2 weeks. Then stop both fluoxetine and venlafaxine .  She should have minimal to no significant withdrawal effects doing it this way.  If there is any problem concern let me know.  [name], MD, DFAPA

## 2024-04-27 ENCOUNTER — Telehealth: Payer: Self-pay | Admitting: Psychiatry

## 2024-04-27 NOTE — Telephone Encounter (Signed)
 Pt called and lvm stating she did not pick Prozac and Effexor  Rx up to taper off. Stated was not comfortable with process and will continue to stay on Effexor  75 mg for now.

## 2024-04-27 NOTE — Telephone Encounter (Signed)
 See message from patient.   I called her to discuss and she said she didn't want to add another medication and she felt the taper off Effexor  was too fast and she wasn't comfortable with it. She said for now she wants to stay on 75 mg Effexor .

## 2024-04-28 NOTE — Telephone Encounter (Signed)
 Use the other option if she wants it:  Get the venlafaxine  25 mg tablets and reduce the dosage to 2-1/2 tablets daily for 2 weeks, then 2 tablets daily for 2 weeks, then 1-1/2 tablets daily for 2 weeks, then 1 tablet daily for 2 weeks, then 1/2 tablet daily for 2 weeks, then stop it. She could have short course take longer to taper it if she wants to but she should not do it any shorter than 2-week intervals.  It also will probably help if she takes part of the daily dose during the morning and part of the daily dose in the evening.  If she wants this option then please send in the venlafaxine  25 mg tablets.  If not we can wait till her next appointment and she can make a decision

## 2024-05-02 NOTE — Telephone Encounter (Signed)
 Sent recommendations via MyChart.

## 2025-03-15 ENCOUNTER — Telehealth: Admitting: Psychiatry
